# Patient Record
Sex: Female | Born: 1935 | Race: White | Hispanic: No | Smoking: Never smoker
Health system: Southern US, Community
[De-identification: ages and names within clinical notes are randomized; demographics above are authoritative.]

## PROBLEM LIST (undated history)

## (undated) DIAGNOSIS — D649 Anemia, unspecified: Secondary | ICD-10-CM

## (undated) DIAGNOSIS — Z9889 Other specified postprocedural states: Secondary | ICD-10-CM

## (undated) DIAGNOSIS — F0390 Unspecified dementia without behavioral disturbance: Secondary | ICD-10-CM

## (undated) DIAGNOSIS — Z9289 Personal history of other medical treatment: Secondary | ICD-10-CM

## (undated) DIAGNOSIS — E1142 Type 2 diabetes mellitus with diabetic polyneuropathy: Secondary | ICD-10-CM

## (undated) DIAGNOSIS — R112 Nausea with vomiting, unspecified: Secondary | ICD-10-CM

## (undated) DIAGNOSIS — G459 Transient cerebral ischemic attack, unspecified: Secondary | ICD-10-CM

## (undated) DIAGNOSIS — I4891 Unspecified atrial fibrillation: Secondary | ICD-10-CM

## (undated) DIAGNOSIS — I10 Essential (primary) hypertension: Secondary | ICD-10-CM

## (undated) DIAGNOSIS — I480 Paroxysmal atrial fibrillation: Secondary | ICD-10-CM

## (undated) DIAGNOSIS — I5032 Chronic diastolic (congestive) heart failure: Secondary | ICD-10-CM

## (undated) DIAGNOSIS — E119 Type 2 diabetes mellitus without complications: Secondary | ICD-10-CM

## (undated) DIAGNOSIS — E039 Hypothyroidism, unspecified: Secondary | ICD-10-CM

## (undated) HISTORY — PX: FRACTURE SURGERY: SHX138

## (undated) HISTORY — DX: Unspecified dementia, unspecified severity, without behavioral disturbance, psychotic disturbance, mood disturbance, and anxiety: F03.90

## (undated) HISTORY — PX: TIBIA FRACTURE SURGERY: SHX806

## (undated) HISTORY — PX: CATARACT EXTRACTION, BILATERAL: SHX1313

## (undated) HISTORY — PX: TONSILLECTOMY: SUR1361

---

## 2016-06-24 DIAGNOSIS — E109 Type 1 diabetes mellitus without complications: Secondary | ICD-10-CM | POA: Diagnosis not present

## 2016-06-28 DIAGNOSIS — E109 Type 1 diabetes mellitus without complications: Secondary | ICD-10-CM | POA: Diagnosis not present

## 2016-07-09 DIAGNOSIS — Z7901 Long term (current) use of anticoagulants: Secondary | ICD-10-CM | POA: Diagnosis not present

## 2016-07-09 DIAGNOSIS — I255 Ischemic cardiomyopathy: Secondary | ICD-10-CM | POA: Diagnosis not present

## 2016-07-16 DIAGNOSIS — I10 Essential (primary) hypertension: Secondary | ICD-10-CM | POA: Diagnosis not present

## 2016-07-16 DIAGNOSIS — E1042 Type 1 diabetes mellitus with diabetic polyneuropathy: Secondary | ICD-10-CM | POA: Diagnosis not present

## 2016-07-16 DIAGNOSIS — E162 Hypoglycemia, unspecified: Secondary | ICD-10-CM | POA: Diagnosis not present

## 2016-07-16 DIAGNOSIS — E039 Hypothyroidism, unspecified: Secondary | ICD-10-CM | POA: Diagnosis not present

## 2016-07-16 DIAGNOSIS — E1065 Type 1 diabetes mellitus with hyperglycemia: Secondary | ICD-10-CM | POA: Diagnosis not present

## 2016-07-30 DIAGNOSIS — E109 Type 1 diabetes mellitus without complications: Secondary | ICD-10-CM | POA: Diagnosis not present

## 2016-08-03 DIAGNOSIS — N390 Urinary tract infection, site not specified: Secondary | ICD-10-CM | POA: Diagnosis not present

## 2016-08-04 DIAGNOSIS — E109 Type 1 diabetes mellitus without complications: Secondary | ICD-10-CM | POA: Diagnosis not present

## 2016-08-06 DIAGNOSIS — I255 Ischemic cardiomyopathy: Secondary | ICD-10-CM | POA: Diagnosis not present

## 2016-08-06 DIAGNOSIS — Z7901 Long term (current) use of anticoagulants: Secondary | ICD-10-CM | POA: Diagnosis not present

## 2016-08-09 DIAGNOSIS — I70293 Other atherosclerosis of native arteries of extremities, bilateral legs: Secondary | ICD-10-CM | POA: Diagnosis not present

## 2016-08-09 DIAGNOSIS — E1149 Type 2 diabetes mellitus with other diabetic neurological complication: Secondary | ICD-10-CM | POA: Diagnosis not present

## 2016-08-09 DIAGNOSIS — E1142 Type 2 diabetes mellitus with diabetic polyneuropathy: Secondary | ICD-10-CM | POA: Diagnosis not present

## 2016-08-09 DIAGNOSIS — B351 Tinea unguium: Secondary | ICD-10-CM | POA: Diagnosis not present

## 2016-08-10 DIAGNOSIS — N390 Urinary tract infection, site not specified: Secondary | ICD-10-CM | POA: Diagnosis not present

## 2016-08-10 DIAGNOSIS — R6889 Other general symptoms and signs: Secondary | ICD-10-CM | POA: Diagnosis not present

## 2016-08-10 DIAGNOSIS — S20219D Contusion of unspecified front wall of thorax, subsequent encounter: Secondary | ICD-10-CM | POA: Diagnosis not present

## 2016-08-10 DIAGNOSIS — I119 Hypertensive heart disease without heart failure: Secondary | ICD-10-CM | POA: Diagnosis not present

## 2016-08-10 DIAGNOSIS — E109 Type 1 diabetes mellitus without complications: Secondary | ICD-10-CM | POA: Diagnosis not present

## 2016-08-10 DIAGNOSIS — R300 Dysuria: Secondary | ICD-10-CM | POA: Diagnosis not present

## 2016-08-18 DIAGNOSIS — I4891 Unspecified atrial fibrillation: Secondary | ICD-10-CM | POA: Diagnosis not present

## 2016-08-18 DIAGNOSIS — I501 Left ventricular failure: Secondary | ICD-10-CM | POA: Diagnosis not present

## 2016-08-18 DIAGNOSIS — I10 Essential (primary) hypertension: Secondary | ICD-10-CM | POA: Diagnosis not present

## 2016-09-03 DIAGNOSIS — I255 Ischemic cardiomyopathy: Secondary | ICD-10-CM | POA: Diagnosis not present

## 2016-09-03 DIAGNOSIS — Z7901 Long term (current) use of anticoagulants: Secondary | ICD-10-CM | POA: Diagnosis not present

## 2016-09-03 DIAGNOSIS — E1065 Type 1 diabetes mellitus with hyperglycemia: Secondary | ICD-10-CM | POA: Diagnosis not present

## 2016-09-03 DIAGNOSIS — E1165 Type 2 diabetes mellitus with hyperglycemia: Secondary | ICD-10-CM | POA: Diagnosis not present

## 2016-09-10 DIAGNOSIS — E039 Hypothyroidism, unspecified: Secondary | ICD-10-CM | POA: Diagnosis not present

## 2016-09-10 DIAGNOSIS — E1065 Type 1 diabetes mellitus with hyperglycemia: Secondary | ICD-10-CM | POA: Diagnosis not present

## 2016-09-10 DIAGNOSIS — E162 Hypoglycemia, unspecified: Secondary | ICD-10-CM | POA: Diagnosis not present

## 2016-09-10 DIAGNOSIS — E1042 Type 1 diabetes mellitus with diabetic polyneuropathy: Secondary | ICD-10-CM | POA: Diagnosis not present

## 2016-09-10 DIAGNOSIS — E1021 Type 1 diabetes mellitus with diabetic nephropathy: Secondary | ICD-10-CM | POA: Diagnosis not present

## 2016-09-10 DIAGNOSIS — I10 Essential (primary) hypertension: Secondary | ICD-10-CM | POA: Diagnosis not present

## 2016-09-28 DIAGNOSIS — I255 Ischemic cardiomyopathy: Secondary | ICD-10-CM | POA: Diagnosis not present

## 2016-09-28 DIAGNOSIS — Z7901 Long term (current) use of anticoagulants: Secondary | ICD-10-CM | POA: Diagnosis not present

## 2016-09-28 DIAGNOSIS — I10 Essential (primary) hypertension: Secondary | ICD-10-CM | POA: Diagnosis not present

## 2016-09-28 DIAGNOSIS — G459 Transient cerebral ischemic attack, unspecified: Secondary | ICD-10-CM | POA: Diagnosis not present

## 2016-09-28 DIAGNOSIS — I669 Occlusion and stenosis of unspecified cerebral artery: Secondary | ICD-10-CM | POA: Diagnosis not present

## 2016-10-04 DIAGNOSIS — E109 Type 1 diabetes mellitus without complications: Secondary | ICD-10-CM | POA: Diagnosis not present

## 2016-10-11 DIAGNOSIS — B351 Tinea unguium: Secondary | ICD-10-CM | POA: Diagnosis not present

## 2016-10-11 DIAGNOSIS — I70293 Other atherosclerosis of native arteries of extremities, bilateral legs: Secondary | ICD-10-CM | POA: Diagnosis not present

## 2016-10-11 DIAGNOSIS — E1149 Type 2 diabetes mellitus with other diabetic neurological complication: Secondary | ICD-10-CM | POA: Diagnosis not present

## 2016-10-11 DIAGNOSIS — E1142 Type 2 diabetes mellitus with diabetic polyneuropathy: Secondary | ICD-10-CM | POA: Diagnosis not present

## 2016-10-26 DIAGNOSIS — Z7901 Long term (current) use of anticoagulants: Secondary | ICD-10-CM | POA: Diagnosis not present

## 2016-10-26 DIAGNOSIS — I255 Ischemic cardiomyopathy: Secondary | ICD-10-CM | POA: Diagnosis not present

## 2016-10-27 DIAGNOSIS — I482 Chronic atrial fibrillation: Secondary | ICD-10-CM | POA: Diagnosis not present

## 2016-10-27 DIAGNOSIS — I10 Essential (primary) hypertension: Secondary | ICD-10-CM | POA: Diagnosis not present

## 2016-10-27 DIAGNOSIS — I5030 Unspecified diastolic (congestive) heart failure: Secondary | ICD-10-CM | POA: Diagnosis not present

## 2016-11-10 DIAGNOSIS — L814 Other melanin hyperpigmentation: Secondary | ICD-10-CM | POA: Diagnosis not present

## 2016-11-10 DIAGNOSIS — G459 Transient cerebral ischemic attack, unspecified: Secondary | ICD-10-CM | POA: Diagnosis not present

## 2016-11-10 DIAGNOSIS — L0102 Bockhart's impetigo: Secondary | ICD-10-CM | POA: Diagnosis not present

## 2016-11-10 DIAGNOSIS — Z7901 Long term (current) use of anticoagulants: Secondary | ICD-10-CM | POA: Diagnosis not present

## 2016-11-10 DIAGNOSIS — I669 Occlusion and stenosis of unspecified cerebral artery: Secondary | ICD-10-CM | POA: Diagnosis not present

## 2016-11-10 DIAGNOSIS — D485 Neoplasm of uncertain behavior of skin: Secondary | ICD-10-CM | POA: Diagnosis not present

## 2016-11-10 DIAGNOSIS — I255 Ischemic cardiomyopathy: Secondary | ICD-10-CM | POA: Diagnosis not present

## 2016-11-29 DIAGNOSIS — I255 Ischemic cardiomyopathy: Secondary | ICD-10-CM | POA: Diagnosis not present

## 2016-11-29 DIAGNOSIS — Z7901 Long term (current) use of anticoagulants: Secondary | ICD-10-CM | POA: Diagnosis not present

## 2016-11-29 DIAGNOSIS — I669 Occlusion and stenosis of unspecified cerebral artery: Secondary | ICD-10-CM | POA: Diagnosis not present

## 2016-11-29 DIAGNOSIS — G459 Transient cerebral ischemic attack, unspecified: Secondary | ICD-10-CM | POA: Diagnosis not present

## 2016-12-10 DIAGNOSIS — E1065 Type 1 diabetes mellitus with hyperglycemia: Secondary | ICD-10-CM | POA: Diagnosis not present

## 2016-12-10 DIAGNOSIS — E039 Hypothyroidism, unspecified: Secondary | ICD-10-CM | POA: Diagnosis not present

## 2016-12-17 DIAGNOSIS — E039 Hypothyroidism, unspecified: Secondary | ICD-10-CM | POA: Diagnosis not present

## 2016-12-17 DIAGNOSIS — I10 Essential (primary) hypertension: Secondary | ICD-10-CM | POA: Diagnosis not present

## 2016-12-17 DIAGNOSIS — E1021 Type 1 diabetes mellitus with diabetic nephropathy: Secondary | ICD-10-CM | POA: Diagnosis not present

## 2016-12-17 DIAGNOSIS — E162 Hypoglycemia, unspecified: Secondary | ICD-10-CM | POA: Diagnosis not present

## 2016-12-17 DIAGNOSIS — E1065 Type 1 diabetes mellitus with hyperglycemia: Secondary | ICD-10-CM | POA: Diagnosis not present

## 2016-12-17 DIAGNOSIS — E1042 Type 1 diabetes mellitus with diabetic polyneuropathy: Secondary | ICD-10-CM | POA: Diagnosis not present

## 2016-12-20 DIAGNOSIS — E1149 Type 2 diabetes mellitus with other diabetic neurological complication: Secondary | ICD-10-CM | POA: Diagnosis not present

## 2016-12-20 DIAGNOSIS — I70293 Other atherosclerosis of native arteries of extremities, bilateral legs: Secondary | ICD-10-CM | POA: Diagnosis not present

## 2016-12-20 DIAGNOSIS — B351 Tinea unguium: Secondary | ICD-10-CM | POA: Diagnosis not present

## 2016-12-28 DIAGNOSIS — I669 Occlusion and stenosis of unspecified cerebral artery: Secondary | ICD-10-CM | POA: Diagnosis not present

## 2016-12-28 DIAGNOSIS — G459 Transient cerebral ischemic attack, unspecified: Secondary | ICD-10-CM | POA: Diagnosis not present

## 2016-12-28 DIAGNOSIS — I255 Ischemic cardiomyopathy: Secondary | ICD-10-CM | POA: Diagnosis not present

## 2016-12-28 DIAGNOSIS — Z7901 Long term (current) use of anticoagulants: Secondary | ICD-10-CM | POA: Diagnosis not present

## 2016-12-30 DIAGNOSIS — E109 Type 1 diabetes mellitus without complications: Secondary | ICD-10-CM | POA: Diagnosis not present

## 2017-01-11 DIAGNOSIS — E119 Type 2 diabetes mellitus without complications: Secondary | ICD-10-CM | POA: Diagnosis not present

## 2017-01-11 DIAGNOSIS — S66911A Strain of unspecified muscle, fascia and tendon at wrist and hand level, right hand, initial encounter: Secondary | ICD-10-CM | POA: Diagnosis not present

## 2017-01-19 DIAGNOSIS — I480 Paroxysmal atrial fibrillation: Secondary | ICD-10-CM | POA: Diagnosis not present

## 2017-01-19 DIAGNOSIS — I10 Essential (primary) hypertension: Secondary | ICD-10-CM | POA: Diagnosis not present

## 2017-01-19 DIAGNOSIS — I5030 Unspecified diastolic (congestive) heart failure: Secondary | ICD-10-CM | POA: Diagnosis not present

## 2017-02-01 DIAGNOSIS — Z7901 Long term (current) use of anticoagulants: Secondary | ICD-10-CM | POA: Diagnosis not present

## 2017-02-01 DIAGNOSIS — I255 Ischemic cardiomyopathy: Secondary | ICD-10-CM | POA: Diagnosis not present

## 2017-02-01 DIAGNOSIS — I669 Occlusion and stenosis of unspecified cerebral artery: Secondary | ICD-10-CM | POA: Diagnosis not present

## 2017-02-01 DIAGNOSIS — G459 Transient cerebral ischemic attack, unspecified: Secondary | ICD-10-CM | POA: Diagnosis not present

## 2017-02-05 DIAGNOSIS — R740 Nonspecific elevation of levels of transaminase and lactic acid dehydrogenase [LDH]: Secondary | ICD-10-CM | POA: Diagnosis not present

## 2017-02-05 DIAGNOSIS — E039 Hypothyroidism, unspecified: Secondary | ICD-10-CM | POA: Diagnosis not present

## 2017-02-05 DIAGNOSIS — E119 Type 2 diabetes mellitus without complications: Secondary | ICD-10-CM | POA: Diagnosis not present

## 2017-02-05 DIAGNOSIS — E784 Other hyperlipidemia: Secondary | ICD-10-CM | POA: Diagnosis not present

## 2017-02-05 DIAGNOSIS — E559 Vitamin D deficiency, unspecified: Secondary | ICD-10-CM | POA: Diagnosis not present

## 2017-02-07 DIAGNOSIS — M9902 Segmental and somatic dysfunction of thoracic region: Secondary | ICD-10-CM | POA: Diagnosis not present

## 2017-02-07 DIAGNOSIS — M7501 Adhesive capsulitis of right shoulder: Secondary | ICD-10-CM | POA: Diagnosis not present

## 2017-02-07 DIAGNOSIS — M9904 Segmental and somatic dysfunction of sacral region: Secondary | ICD-10-CM | POA: Diagnosis not present

## 2017-02-07 DIAGNOSIS — S63502A Unspecified sprain of left wrist, initial encounter: Secondary | ICD-10-CM | POA: Diagnosis not present

## 2017-02-07 DIAGNOSIS — M9903 Segmental and somatic dysfunction of lumbar region: Secondary | ICD-10-CM | POA: Diagnosis not present

## 2017-02-07 DIAGNOSIS — M5416 Radiculopathy, lumbar region: Secondary | ICD-10-CM | POA: Diagnosis not present

## 2017-02-07 DIAGNOSIS — E119 Type 2 diabetes mellitus without complications: Secondary | ICD-10-CM | POA: Diagnosis not present

## 2017-02-09 DIAGNOSIS — M9903 Segmental and somatic dysfunction of lumbar region: Secondary | ICD-10-CM | POA: Diagnosis not present

## 2017-02-09 DIAGNOSIS — M9902 Segmental and somatic dysfunction of thoracic region: Secondary | ICD-10-CM | POA: Diagnosis not present

## 2017-02-09 DIAGNOSIS — M9904 Segmental and somatic dysfunction of sacral region: Secondary | ICD-10-CM | POA: Diagnosis not present

## 2017-02-09 DIAGNOSIS — M5416 Radiculopathy, lumbar region: Secondary | ICD-10-CM | POA: Diagnosis not present

## 2017-02-10 DIAGNOSIS — M9903 Segmental and somatic dysfunction of lumbar region: Secondary | ICD-10-CM | POA: Diagnosis not present

## 2017-02-10 DIAGNOSIS — M5416 Radiculopathy, lumbar region: Secondary | ICD-10-CM | POA: Diagnosis not present

## 2017-02-10 DIAGNOSIS — M9904 Segmental and somatic dysfunction of sacral region: Secondary | ICD-10-CM | POA: Diagnosis not present

## 2017-02-10 DIAGNOSIS — M9902 Segmental and somatic dysfunction of thoracic region: Secondary | ICD-10-CM | POA: Diagnosis not present

## 2017-02-14 DIAGNOSIS — M9902 Segmental and somatic dysfunction of thoracic region: Secondary | ICD-10-CM | POA: Diagnosis not present

## 2017-02-14 DIAGNOSIS — M9904 Segmental and somatic dysfunction of sacral region: Secondary | ICD-10-CM | POA: Diagnosis not present

## 2017-02-14 DIAGNOSIS — M5416 Radiculopathy, lumbar region: Secondary | ICD-10-CM | POA: Diagnosis not present

## 2017-02-14 DIAGNOSIS — M9903 Segmental and somatic dysfunction of lumbar region: Secondary | ICD-10-CM | POA: Diagnosis not present

## 2017-02-16 DIAGNOSIS — M9902 Segmental and somatic dysfunction of thoracic region: Secondary | ICD-10-CM | POA: Diagnosis not present

## 2017-02-16 DIAGNOSIS — G459 Transient cerebral ischemic attack, unspecified: Secondary | ICD-10-CM | POA: Diagnosis not present

## 2017-02-16 DIAGNOSIS — M9903 Segmental and somatic dysfunction of lumbar region: Secondary | ICD-10-CM | POA: Diagnosis not present

## 2017-02-16 DIAGNOSIS — Z7901 Long term (current) use of anticoagulants: Secondary | ICD-10-CM | POA: Diagnosis not present

## 2017-02-16 DIAGNOSIS — I669 Occlusion and stenosis of unspecified cerebral artery: Secondary | ICD-10-CM | POA: Diagnosis not present

## 2017-02-16 DIAGNOSIS — M9904 Segmental and somatic dysfunction of sacral region: Secondary | ICD-10-CM | POA: Diagnosis not present

## 2017-02-16 DIAGNOSIS — M5416 Radiculopathy, lumbar region: Secondary | ICD-10-CM | POA: Diagnosis not present

## 2017-02-16 DIAGNOSIS — I255 Ischemic cardiomyopathy: Secondary | ICD-10-CM | POA: Diagnosis not present

## 2017-02-18 DIAGNOSIS — M9904 Segmental and somatic dysfunction of sacral region: Secondary | ICD-10-CM | POA: Diagnosis not present

## 2017-02-18 DIAGNOSIS — M5416 Radiculopathy, lumbar region: Secondary | ICD-10-CM | POA: Diagnosis not present

## 2017-02-18 DIAGNOSIS — M9902 Segmental and somatic dysfunction of thoracic region: Secondary | ICD-10-CM | POA: Diagnosis not present

## 2017-02-18 DIAGNOSIS — M9903 Segmental and somatic dysfunction of lumbar region: Secondary | ICD-10-CM | POA: Diagnosis not present

## 2017-02-23 DIAGNOSIS — M9902 Segmental and somatic dysfunction of thoracic region: Secondary | ICD-10-CM | POA: Diagnosis not present

## 2017-02-23 DIAGNOSIS — M5416 Radiculopathy, lumbar region: Secondary | ICD-10-CM | POA: Diagnosis not present

## 2017-02-23 DIAGNOSIS — M9904 Segmental and somatic dysfunction of sacral region: Secondary | ICD-10-CM | POA: Diagnosis not present

## 2017-02-23 DIAGNOSIS — M9903 Segmental and somatic dysfunction of lumbar region: Secondary | ICD-10-CM | POA: Diagnosis not present

## 2017-03-03 DIAGNOSIS — S43401A Unspecified sprain of right shoulder joint, initial encounter: Secondary | ICD-10-CM | POA: Diagnosis not present

## 2017-03-03 DIAGNOSIS — M9902 Segmental and somatic dysfunction of thoracic region: Secondary | ICD-10-CM | POA: Diagnosis not present

## 2017-03-03 DIAGNOSIS — M9903 Segmental and somatic dysfunction of lumbar region: Secondary | ICD-10-CM | POA: Diagnosis not present

## 2017-03-03 DIAGNOSIS — M5416 Radiculopathy, lumbar region: Secondary | ICD-10-CM | POA: Diagnosis not present

## 2017-03-03 DIAGNOSIS — M9904 Segmental and somatic dysfunction of sacral region: Secondary | ICD-10-CM | POA: Diagnosis not present

## 2017-03-03 DIAGNOSIS — E784 Other hyperlipidemia: Secondary | ICD-10-CM | POA: Diagnosis not present

## 2017-03-07 DIAGNOSIS — I255 Ischemic cardiomyopathy: Secondary | ICD-10-CM | POA: Diagnosis not present

## 2017-03-07 DIAGNOSIS — G459 Transient cerebral ischemic attack, unspecified: Secondary | ICD-10-CM | POA: Diagnosis not present

## 2017-03-07 DIAGNOSIS — Z7901 Long term (current) use of anticoagulants: Secondary | ICD-10-CM | POA: Diagnosis not present

## 2017-03-07 DIAGNOSIS — I669 Occlusion and stenosis of unspecified cerebral artery: Secondary | ICD-10-CM | POA: Diagnosis not present

## 2017-03-11 DIAGNOSIS — G459 Transient cerebral ischemic attack, unspecified: Secondary | ICD-10-CM | POA: Diagnosis not present

## 2017-03-11 DIAGNOSIS — I255 Ischemic cardiomyopathy: Secondary | ICD-10-CM | POA: Diagnosis not present

## 2017-03-11 DIAGNOSIS — I669 Occlusion and stenosis of unspecified cerebral artery: Secondary | ICD-10-CM | POA: Diagnosis not present

## 2017-03-11 DIAGNOSIS — Z7901 Long term (current) use of anticoagulants: Secondary | ICD-10-CM | POA: Diagnosis not present

## 2017-03-15 DIAGNOSIS — E1065 Type 1 diabetes mellitus with hyperglycemia: Secondary | ICD-10-CM | POA: Diagnosis not present

## 2017-03-17 DIAGNOSIS — I10 Essential (primary) hypertension: Secondary | ICD-10-CM | POA: Diagnosis not present

## 2017-03-17 DIAGNOSIS — I255 Ischemic cardiomyopathy: Secondary | ICD-10-CM | POA: Diagnosis not present

## 2017-03-17 DIAGNOSIS — I669 Occlusion and stenosis of unspecified cerebral artery: Secondary | ICD-10-CM | POA: Diagnosis not present

## 2017-03-17 DIAGNOSIS — E1065 Type 1 diabetes mellitus with hyperglycemia: Secondary | ICD-10-CM | POA: Diagnosis not present

## 2017-03-17 DIAGNOSIS — E039 Hypothyroidism, unspecified: Secondary | ICD-10-CM | POA: Diagnosis not present

## 2017-03-17 DIAGNOSIS — Z7901 Long term (current) use of anticoagulants: Secondary | ICD-10-CM | POA: Diagnosis not present

## 2017-03-17 DIAGNOSIS — E1021 Type 1 diabetes mellitus with diabetic nephropathy: Secondary | ICD-10-CM | POA: Diagnosis not present

## 2017-03-17 DIAGNOSIS — G459 Transient cerebral ischemic attack, unspecified: Secondary | ICD-10-CM | POA: Diagnosis not present

## 2017-03-17 DIAGNOSIS — E162 Hypoglycemia, unspecified: Secondary | ICD-10-CM | POA: Diagnosis not present

## 2017-03-17 DIAGNOSIS — E1042 Type 1 diabetes mellitus with diabetic polyneuropathy: Secondary | ICD-10-CM | POA: Diagnosis not present

## 2017-03-18 DIAGNOSIS — M9904 Segmental and somatic dysfunction of sacral region: Secondary | ICD-10-CM | POA: Diagnosis not present

## 2017-03-18 DIAGNOSIS — M9903 Segmental and somatic dysfunction of lumbar region: Secondary | ICD-10-CM | POA: Diagnosis not present

## 2017-03-18 DIAGNOSIS — M9902 Segmental and somatic dysfunction of thoracic region: Secondary | ICD-10-CM | POA: Diagnosis not present

## 2017-03-18 DIAGNOSIS — M5416 Radiculopathy, lumbar region: Secondary | ICD-10-CM | POA: Diagnosis not present

## 2017-03-30 DIAGNOSIS — N390 Urinary tract infection, site not specified: Secondary | ICD-10-CM | POA: Diagnosis not present

## 2017-03-30 DIAGNOSIS — A499 Bacterial infection, unspecified: Secondary | ICD-10-CM | POA: Diagnosis not present

## 2017-03-30 DIAGNOSIS — E109 Type 1 diabetes mellitus without complications: Secondary | ICD-10-CM | POA: Diagnosis not present

## 2017-03-30 DIAGNOSIS — R300 Dysuria: Secondary | ICD-10-CM | POA: Diagnosis not present

## 2017-04-01 DIAGNOSIS — Z7901 Long term (current) use of anticoagulants: Secondary | ICD-10-CM | POA: Diagnosis not present

## 2017-04-05 DIAGNOSIS — E119 Type 2 diabetes mellitus without complications: Secondary | ICD-10-CM | POA: Diagnosis not present

## 2017-04-05 DIAGNOSIS — R300 Dysuria: Secondary | ICD-10-CM | POA: Diagnosis not present

## 2017-04-11 DIAGNOSIS — M79674 Pain in right toe(s): Secondary | ICD-10-CM | POA: Diagnosis not present

## 2017-04-11 DIAGNOSIS — E1151 Type 2 diabetes mellitus with diabetic peripheral angiopathy without gangrene: Secondary | ICD-10-CM | POA: Diagnosis not present

## 2017-04-11 DIAGNOSIS — I739 Peripheral vascular disease, unspecified: Secondary | ICD-10-CM | POA: Diagnosis not present

## 2017-04-11 DIAGNOSIS — M79675 Pain in left toe(s): Secondary | ICD-10-CM | POA: Diagnosis not present

## 2017-04-11 DIAGNOSIS — B351 Tinea unguium: Secondary | ICD-10-CM | POA: Diagnosis not present

## 2017-04-12 DIAGNOSIS — E119 Type 2 diabetes mellitus without complications: Secondary | ICD-10-CM | POA: Diagnosis not present

## 2017-04-12 DIAGNOSIS — N390 Urinary tract infection, site not specified: Secondary | ICD-10-CM | POA: Diagnosis not present

## 2017-04-12 DIAGNOSIS — A499 Bacterial infection, unspecified: Secondary | ICD-10-CM | POA: Diagnosis not present

## 2017-04-14 DIAGNOSIS — E1021 Type 1 diabetes mellitus with diabetic nephropathy: Secondary | ICD-10-CM | POA: Diagnosis not present

## 2017-04-14 DIAGNOSIS — Z6828 Body mass index (BMI) 28.0-28.9, adult: Secondary | ICD-10-CM | POA: Diagnosis not present

## 2017-04-14 DIAGNOSIS — Z7901 Long term (current) use of anticoagulants: Secondary | ICD-10-CM | POA: Diagnosis not present

## 2017-04-14 DIAGNOSIS — I480 Paroxysmal atrial fibrillation: Secondary | ICD-10-CM | POA: Diagnosis not present

## 2017-04-14 DIAGNOSIS — R2689 Other abnormalities of gait and mobility: Secondary | ICD-10-CM | POA: Diagnosis not present

## 2017-04-14 DIAGNOSIS — N08 Glomerular disorders in diseases classified elsewhere: Secondary | ICD-10-CM | POA: Diagnosis not present

## 2017-04-14 DIAGNOSIS — E1142 Type 2 diabetes mellitus with diabetic polyneuropathy: Secondary | ICD-10-CM | POA: Diagnosis not present

## 2017-04-14 DIAGNOSIS — E038 Other specified hypothyroidism: Secondary | ICD-10-CM | POA: Diagnosis not present

## 2017-04-14 DIAGNOSIS — D6489 Other specified anemias: Secondary | ICD-10-CM | POA: Diagnosis not present

## 2017-04-14 DIAGNOSIS — I7389 Other specified peripheral vascular diseases: Secondary | ICD-10-CM | POA: Diagnosis not present

## 2017-04-21 DIAGNOSIS — Z6820 Body mass index (BMI) 20.0-20.9, adult: Secondary | ICD-10-CM | POA: Diagnosis not present

## 2017-04-21 DIAGNOSIS — R350 Frequency of micturition: Secondary | ICD-10-CM | POA: Diagnosis not present

## 2017-04-21 DIAGNOSIS — N390 Urinary tract infection, site not specified: Secondary | ICD-10-CM | POA: Diagnosis not present

## 2017-04-21 DIAGNOSIS — R8290 Unspecified abnormal findings in urine: Secondary | ICD-10-CM | POA: Diagnosis not present

## 2017-04-21 DIAGNOSIS — E1021 Type 1 diabetes mellitus with diabetic nephropathy: Secondary | ICD-10-CM | POA: Diagnosis not present

## 2017-04-29 DIAGNOSIS — Z7901 Long term (current) use of anticoagulants: Secondary | ICD-10-CM | POA: Diagnosis not present

## 2017-05-04 DIAGNOSIS — B9689 Other specified bacterial agents as the cause of diseases classified elsewhere: Secondary | ICD-10-CM | POA: Diagnosis not present

## 2017-05-04 DIAGNOSIS — L02425 Furuncle of right lower limb: Secondary | ICD-10-CM | POA: Diagnosis not present

## 2017-05-09 DIAGNOSIS — N08 Glomerular disorders in diseases classified elsewhere: Secondary | ICD-10-CM | POA: Diagnosis not present

## 2017-05-09 DIAGNOSIS — E038 Other specified hypothyroidism: Secondary | ICD-10-CM | POA: Diagnosis not present

## 2017-05-09 DIAGNOSIS — Z7901 Long term (current) use of anticoagulants: Secondary | ICD-10-CM | POA: Diagnosis not present

## 2017-05-09 DIAGNOSIS — E1021 Type 1 diabetes mellitus with diabetic nephropathy: Secondary | ICD-10-CM | POA: Diagnosis not present

## 2017-05-09 DIAGNOSIS — L089 Local infection of the skin and subcutaneous tissue, unspecified: Secondary | ICD-10-CM | POA: Diagnosis not present

## 2017-05-09 DIAGNOSIS — R609 Edema, unspecified: Secondary | ICD-10-CM | POA: Diagnosis not present

## 2017-05-20 DIAGNOSIS — E1021 Type 1 diabetes mellitus with diabetic nephropathy: Secondary | ICD-10-CM | POA: Diagnosis not present

## 2017-05-25 DIAGNOSIS — Z794 Long term (current) use of insulin: Secondary | ICD-10-CM | POA: Diagnosis not present

## 2017-05-25 DIAGNOSIS — I10 Essential (primary) hypertension: Secondary | ICD-10-CM | POA: Diagnosis not present

## 2017-05-25 DIAGNOSIS — L720 Epidermal cyst: Secondary | ICD-10-CM | POA: Diagnosis not present

## 2017-05-25 DIAGNOSIS — E1021 Type 1 diabetes mellitus with diabetic nephropathy: Secondary | ICD-10-CM | POA: Diagnosis not present

## 2017-05-27 DIAGNOSIS — Z7901 Long term (current) use of anticoagulants: Secondary | ICD-10-CM | POA: Diagnosis not present

## 2017-06-02 DIAGNOSIS — R350 Frequency of micturition: Secondary | ICD-10-CM | POA: Diagnosis not present

## 2017-06-02 DIAGNOSIS — N390 Urinary tract infection, site not specified: Secondary | ICD-10-CM | POA: Diagnosis not present

## 2017-06-03 DIAGNOSIS — L02425 Furuncle of right lower limb: Secondary | ICD-10-CM | POA: Diagnosis not present

## 2017-06-03 DIAGNOSIS — I872 Venous insufficiency (chronic) (peripheral): Secondary | ICD-10-CM | POA: Diagnosis not present

## 2017-06-03 DIAGNOSIS — B9689 Other specified bacterial agents as the cause of diseases classified elsewhere: Secondary | ICD-10-CM | POA: Diagnosis not present

## 2017-06-08 ENCOUNTER — Encounter (HOSPITAL_COMMUNITY): Payer: Self-pay | Admitting: *Deleted

## 2017-06-08 ENCOUNTER — Inpatient Hospital Stay (HOSPITAL_COMMUNITY)
Admission: EM | Admit: 2017-06-08 | Discharge: 2017-06-10 | DRG: 291 | Disposition: A | Discharge status: Home / Self Care | Payer: Medicare HMO | Attending: Family Medicine | Admitting: Family Medicine

## 2017-06-08 ENCOUNTER — Other Ambulatory Visit: Payer: Self-pay

## 2017-06-08 ENCOUNTER — Emergency Department (HOSPITAL_COMMUNITY): Payer: Medicare HMO

## 2017-06-08 DIAGNOSIS — I110 Hypertensive heart disease with heart failure: Principal | ICD-10-CM | POA: Diagnosis present

## 2017-06-08 DIAGNOSIS — Z91018 Allergy to other foods: Secondary | ICD-10-CM

## 2017-06-08 DIAGNOSIS — E119 Type 2 diabetes mellitus without complications: Secondary | ICD-10-CM | POA: Diagnosis present

## 2017-06-08 DIAGNOSIS — Z8673 Personal history of transient ischemic attack (TIA), and cerebral infarction without residual deficits: Secondary | ICD-10-CM | POA: Diagnosis not present

## 2017-06-08 DIAGNOSIS — Z79890 Hormone replacement therapy (postmenopausal): Secondary | ICD-10-CM

## 2017-06-08 DIAGNOSIS — I482 Chronic atrial fibrillation: Secondary | ICD-10-CM | POA: Diagnosis not present

## 2017-06-08 DIAGNOSIS — E118 Type 2 diabetes mellitus with unspecified complications: Secondary | ICD-10-CM | POA: Diagnosis not present

## 2017-06-08 DIAGNOSIS — J9601 Acute respiratory failure with hypoxia: Secondary | ICD-10-CM | POA: Diagnosis present

## 2017-06-08 DIAGNOSIS — E039 Hypothyroidism, unspecified: Secondary | ICD-10-CM | POA: Diagnosis present

## 2017-06-08 DIAGNOSIS — Z7901 Long term (current) use of anticoagulants: Secondary | ICD-10-CM

## 2017-06-08 DIAGNOSIS — Z794 Long term (current) use of insulin: Secondary | ICD-10-CM | POA: Diagnosis not present

## 2017-06-08 DIAGNOSIS — I5033 Acute on chronic diastolic (congestive) heart failure: Secondary | ICD-10-CM | POA: Diagnosis present

## 2017-06-08 DIAGNOSIS — Z7982 Long term (current) use of aspirin: Secondary | ICD-10-CM | POA: Diagnosis not present

## 2017-06-08 DIAGNOSIS — I361 Nonrheumatic tricuspid (valve) insufficiency: Secondary | ICD-10-CM | POA: Diagnosis not present

## 2017-06-08 DIAGNOSIS — R0902 Hypoxemia: Secondary | ICD-10-CM

## 2017-06-08 DIAGNOSIS — I10 Essential (primary) hypertension: Secondary | ICD-10-CM | POA: Diagnosis not present

## 2017-06-08 DIAGNOSIS — I509 Heart failure, unspecified: Secondary | ICD-10-CM | POA: Diagnosis not present

## 2017-06-08 DIAGNOSIS — R0602 Shortness of breath: Secondary | ICD-10-CM | POA: Diagnosis not present

## 2017-06-08 LAB — BASIC METABOLIC PANEL
ANION GAP: 9 (ref 5–15)
Anion gap: 9 (ref 5–15)
BUN: 21 mg/dL — ABNORMAL HIGH (ref 6–20)
BUN: 21 mg/dL — ABNORMAL HIGH (ref 6–20)
CALCIUM: 9 mg/dL (ref 8.9–10.3)
CO2: 22 mmol/L (ref 22–32)
CO2: 26 mmol/L (ref 22–32)
Calcium: 9.3 mg/dL (ref 8.9–10.3)
Chloride: 98 mmol/L — ABNORMAL LOW (ref 101–111)
Chloride: 101 mmol/L (ref 101–111)
Creatinine, Ser: 0.84 mg/dL (ref 0.44–1.00)
Creatinine, Ser: 0.97 mg/dL (ref 0.44–1.00)
GFR calc Af Amer: 59 mL/min — ABNORMAL LOW (ref >60)
GFR calc non Af Amer: 51 mL/min — ABNORMAL LOW (ref >60)
Glucose, Bld: 246 mg/dL — ABNORMAL HIGH (ref 65–99)
Glucose, Bld: 83 mg/dL (ref 65–99)
POTASSIUM: 4.2 mmol/L (ref 3.5–5.1)
Potassium: 4.3 mmol/L (ref 3.5–5.1)
SODIUM: 129 mmol/L — AB (ref 135–145)
Sodium: 136 mmol/L (ref 135–145)

## 2017-06-08 LAB — PROTIME-INR
INR: 2.08
Prothrombin Time: 23.2 seconds — ABNORMAL HIGH (ref 11.4–15.2)

## 2017-06-08 LAB — CBC
HEMATOCRIT: 37 % (ref 36.0–46.0)
HEMOGLOBIN: 12.3 g/dL (ref 12.0–15.0)
MCH: 28.3 pg (ref 26.0–34.0)
MCHC: 33.2 g/dL (ref 30.0–36.0)
MCV: 85.3 fL (ref 78.0–100.0)
Platelets: 217 10*3/uL (ref 150–400)
RBC: 4.34 MIL/uL (ref 3.87–5.11)
RDW: 15.5 % (ref 11.5–15.5)
WBC: 5.8 10*3/uL (ref 4.0–10.5)

## 2017-06-08 LAB — HEMOGLOBIN A1C
Hgb A1c MFr Bld: 7.9 % — ABNORMAL HIGH (ref 4.8–5.6)
Mean Plasma Glucose: 180.03 mg/dL

## 2017-06-08 LAB — GLUCOSE, CAPILLARY: Glucose-Capillary: 207 mg/dL — ABNORMAL HIGH (ref 65–99)

## 2017-06-08 LAB — I-STAT TROPONIN, ED: TROPONIN I, POC: 0.01 ng/mL (ref 0.00–0.08)

## 2017-06-08 LAB — TROPONIN I: Troponin I: <0.03 ng/mL (ref <0.03)

## 2017-06-08 LAB — CBG MONITORING, ED: Glucose-Capillary: 208 mg/dL — ABNORMAL HIGH (ref 65–99)

## 2017-06-08 LAB — MAGNESIUM: Magnesium: 1.9 mg/dL (ref 1.7–2.4)

## 2017-06-08 LAB — BRAIN NATRIURETIC PEPTIDE: B NATRIURETIC PEPTIDE 5: 317.3 pg/mL — AB (ref 0.0–100.0)

## 2017-06-08 MED ORDER — WARFARIN - PHARMACIST DOSING INPATIENT
Freq: Every day | Status: DC
  Administered 2017-06-09: 18:00:00

## 2017-06-08 MED ORDER — WARFARIN SODIUM 2.5 MG PO TABS: 2.5000 mg | ORAL_TABLET | ORAL | Status: DC

## 2017-06-08 MED ORDER — WARFARIN SODIUM 5 MG PO TABS
5.0000 mg | ORAL_TABLET | ORAL | Status: DC
  Administered 2017-06-09: 5 mg via ORAL
  Filled 2017-06-08: qty 1

## 2017-06-08 MED ORDER — WARFARIN SODIUM 5 MG PO TABS: 5.0000 mg | ORAL_TABLET | ORAL | Status: DC

## 2017-06-08 MED ORDER — FUROSEMIDE 10 MG/ML IJ SOLN
40.0000 mg | Freq: Two times a day (BID) | INTRAMUSCULAR | Status: DC
  Administered 2017-06-08 – 2017-06-10 (×4): 40 mg via INTRAVENOUS
  Filled 2017-06-08 (×4): qty 4

## 2017-06-08 MED ORDER — INSULIN ASPART 100 UNIT/ML ~~LOC~~ SOLN
0.0000 [IU] | Freq: Three times a day (TID) | SUBCUTANEOUS | Status: DC
  Administered 2017-06-08: 3 [IU] via SUBCUTANEOUS
  Administered 2017-06-09: 5 [IU] via SUBCUTANEOUS
  Administered 2017-06-09: 3 [IU] via SUBCUTANEOUS
  Administered 2017-06-09: 1 [IU] via SUBCUTANEOUS
  Administered 2017-06-10: 5 [IU] via SUBCUTANEOUS
  Administered 2017-06-10: 1 [IU] via SUBCUTANEOUS
  Filled 2017-06-08: qty 1

## 2017-06-08 MED ORDER — ASPIRIN EC 81 MG PO TBEC
81.0000 mg | DELAYED_RELEASE_TABLET | Freq: Every day | ORAL | Status: DC
  Administered 2017-06-09 – 2017-06-10 (×2): 81 mg via ORAL
  Filled 2017-06-08 (×2): qty 1

## 2017-06-08 MED ORDER — AMLODIPINE BESYLATE 10 MG PO TABS
10.0000 mg | ORAL_TABLET | Freq: Every day | ORAL | Status: DC
  Administered 2017-06-09 – 2017-06-10 (×2): 10 mg via ORAL
  Filled 2017-06-08 (×2): qty 1

## 2017-06-08 MED ORDER — FUROSEMIDE 10 MG/ML IJ SOLN
40.0000 mg | Freq: Once | INTRAMUSCULAR | Status: AC
  Administered 2017-06-08: 40 mg via INTRAVENOUS
  Filled 2017-06-08: qty 4

## 2017-06-08 MED ORDER — AMOXICILLIN-POT CLAVULANATE 875-125 MG PO TABS
1.0000 | ORAL_TABLET | Freq: Two times a day (BID) | ORAL | Status: DC
  Administered 2017-06-08 – 2017-06-09 (×2): 1 via ORAL
  Filled 2017-06-08 (×2): qty 1

## 2017-06-08 MED ORDER — OCUVITE-LUTEIN PO CAPS
1.0000 | ORAL_CAPSULE | Freq: Every day | ORAL | Status: DC
  Filled 2017-06-08: qty 1

## 2017-06-08 MED ORDER — LEVOTHYROXINE SODIUM 50 MCG PO TABS
50.0000 ug | ORAL_TABLET | Freq: Every day | ORAL | Status: DC
  Administered 2017-06-09 – 2017-06-10 (×2): 50 ug via ORAL
  Filled 2017-06-08 (×2): qty 1

## 2017-06-08 MED ORDER — CARVEDILOL 3.125 MG PO TABS
3.1250 mg | ORAL_TABLET | Freq: Two times a day (BID) | ORAL | Status: DC
  Administered 2017-06-09: 3.125 mg via ORAL
  Filled 2017-06-08: qty 1

## 2017-06-08 MED ORDER — WARFARIN SODIUM 2.5 MG PO TABS
2.5000 mg | ORAL_TABLET | ORAL | Status: DC
  Administered 2017-06-08: 2.5 mg via ORAL
  Filled 2017-06-08: qty 1

## 2017-06-08 MED ORDER — LISINOPRIL 10 MG PO TABS
10.0000 mg | ORAL_TABLET | Freq: Every day | ORAL | Status: DC
  Administered 2017-06-09 – 2017-06-10 (×2): 10 mg via ORAL
  Filled 2017-06-08 (×2): qty 1

## 2017-06-08 NOTE — Progress Notes (Signed)
ANTICOAGULATION CONSULT NOTE - Initial Consult  Pharmacy Consult for Warfarin Indication: atrial fibrillation  Allergies  Allergen Reactions  . Cantaloupe (Diagnostic) Swelling    Patient Measurements: Height: 5\' 1"  (154.9 cm) Weight: 126 lb 9.6 oz (57.4 kg) IBW/kg (Calculated) : 47.8  Vital Signs: Temp: 97.5 F (36.4 C) (12/19 2025) Temp Source: Axillary (12/19 2025) BP: 187/82 (12/19 2025) Pulse Rate: 77 (12/19 2025)  Labs: Recent Labs    06/08/17 1014 06/08/17 2127  HGB 12.3  --   HCT 37.0  --   PLT 217  --   LABPROT  --  23.2*  INR  --  2.08  CREATININE 0.84  --     Estimated Creatinine Clearance: 37.7 mL/min (by C-G formula based on SCr of 0.84 mg/dL).   Medical History: Past Medical History:  Diagnosis Date  . Diabetes mellitus without complication (HCC)     Medications:  Medications Prior to Admission  Medication Sig Dispense Refill Last Dose  . amLODipine (NORVASC) 10 MG tablet Take 10 mg by mouth daily.   06/08/2017 at Unknown time  . amoxicillin-clavulanate (AUGMENTIN) 875-125 MG tablet Take 1 tablet by mouth 2 (two) times daily.   06/07/2017 at Unknown time  . aspirin EC 81 MG tablet Take 81 mg by mouth daily.   06/08/2017 at Unknown time  . Bilberry 500 MG CAPS Take 1 capsule by mouth daily.   06/07/2017 at Unknown time  . FeAspGl-FeFum-B12-FA-C-Succ Ac (FERREX 28) MISC Take 28 mg by mouth every other day.   06/07/2017 at Unknown time  . insulin degludec (TRESIBA FLEXTOUCH) 100 UNIT/ML SOPN FlexTouch Pen Inject 18 Units into the skin daily.   06/08/2017 at Unknown time  . insulin lispro (HUMALOG) 100 UNIT/ML cartridge Inject 1 Units into the skin See admin instructions. 1 unit per 15 carbs per sliding scale,adding  1 unit if BS is 150-200.   06/08/2017 at Unknown time  . levothyroxine (SYNTHROID, LEVOTHROID) 50 MCG tablet Take 50 mcg by mouth daily before breakfast.   06/07/2017 at Unknown time  . lisinopril (PRINIVIL,ZESTRIL) 10 MG tablet Take 10 mg  by mouth daily.   06/08/2017 at Unknown time  . multivitamin-lutein (OCUVITE-LUTEIN) CAPS capsule Take 1 capsule by mouth daily.   06/07/2017 at Unknown time  . mupirocin ointment (BACTROBAN) 2 % Apply 1 application topically 3 (three) times daily.   06/07/2017 at Unknown time  . Omega 3-6-9 Fatty Acids (OMEGA 3-6-9 COMPLEX PO) Take 1 capsule by mouth 2 (two) times daily.   06/07/2017 at Unknown time  . warfarin (COUMADIN) 2.5 MG tablet Take 2.5 mg by mouth See admin instructions. Takes 2.5mg  on Monday, Wednesday and Saturday.   06/06/2017  . warfarin (COUMADIN) 5 MG tablet Take 5 mg by mouth See admin instructions. Taking 5mg  on Sunday, Tuesday,Thursday, Friday.   06/07/2017 at Unknown time    Assessment: 83 yo F presented to ED 12/19 with SOB, DOE, and worsening LE edema. Pharmacy consulted to resume home warfarin for h/o Afib. INR on admission is therapeutic at 2.08. H/H and Plt wnl.   Home warfarin dose: 2.5 mg on M/W/Sat; 5 mg on other days  Goal of Therapy:  INR 2-3 Monitor platelets by anticoagulation protocol: Yes   Plan:  -Resume home warfarin regimen -Monitor daily INR and s/s of bleding  Albertina Parr, PharmD., BCPS Clinical Pharmacist Pager [phone]

## 2017-06-08 NOTE — ED Triage Notes (Signed)
Pt is here with trouble catching her breath for a couple of days.  No pain. No pain with deep breath

## 2017-06-08 NOTE — ED Provider Notes (Signed)
Emergency Department Provider Note   I have reviewed the triage vital signs and the nursing notes.   HISTORY  Chief Complaint Shortness of Breath   HPI Judy Kent is a 83 y.o. female with PMH of DM, dCHF, A fib, and HTN presents to the ED for evaluation of LE edema and progressively worsening SOB.  The patient is living in [place] for the winter with her daughter.  Her last echo was in [place] approximately 2 years prior where they appreciated some diastolic CHF but no other information is available at this time.  Patient states she began feeling short of breath this morning worse with exertion but also having some shortness of breath at rest.  She denies chest pain.  No productive cough, fevers, chills.  Patient had been on HCTZ briefly for increased leg swelling but did not continue that under the direction of her primary care physician.  She has not established care with a cardiologist here in [place].  She does not take Lasix.    Past Medical History:  Diagnosis Date  . Diabetes mellitus without complication ([place])     There are no active problems to display for this patient.   Past Surgical History:  Procedure Laterality Date  . TONSILLECTOMY        Allergies Cantaloupe (diagnostic)  No family history on file.  Social History Social History   Tobacco Use  . Smoking status: Never Smoker  . Smokeless tobacco: Never Used  Substance Use Topics  . Alcohol use: No    Frequency: Never  . Drug use: No    Review of Systems  Constitutional: No fever/chills Eyes: No visual changes. ENT: No sore throat. Cardiovascular: Denies chest pain. Respiratory: Positive shortness of breath and worsening LE edema.  Gastrointestinal: No abdominal pain.  No nausea, no vomiting.  No diarrhea.  No constipation. Genitourinary: Negative for dysuria. Musculoskeletal: Negative for back pain. Skin: Negative for rash. Neurological: Negative for headaches, focal weakness or  numbness.  10-point ROS otherwise negative.  ____________________________________________   PHYSICAL EXAM:  VITAL SIGNS: ED Triage Vitals [06/08/17 1006]  Enc Vitals Group     BP (!) 162/61     Pulse Rate 60     Resp 20     Temp 98.5 F (36.9 C)     Temp Source Oral     SpO2 93 %   Constitutional: Alert and oriented. Well appearing and in no acute distress. Eyes: Conjunctivae are normal.  Head: Atraumatic. Nose: No congestion/rhinnorhea. Mouth/Throat: Mucous membranes are moist.  Oropharynx non-erythematous. Neck: No stridor.   Cardiovascular: Normal rate, regular rhythm. Good peripheral circulation. Grossly normal heart sounds. JVP to the ankle of the mandible.  Respiratory: Increased respiratory effort with minimal exertion.  No retractions. Lungs with crackles at the bases. Gastrointestinal: Soft and nontender. No distention.  Musculoskeletal: No lower extremity tenderness but 2+ pitting edema in B/L LEs. No gross deformities of extremities. Neurologic:  Normal speech and language. No gross focal neurologic deficits are appreciated.  Skin:  Skin is warm, dry and intact. No rash noted.  ____________________________________________   LABS (all labs ordered are listed, but only abnormal results are displayed)  Labs Reviewed  BASIC METABOLIC PANEL - Abnormal; Notable for the following components:      Result Value   Sodium 129 (*)    Chloride 98 (*)    Glucose, Bld 246 (*)    BUN 21 (*)    All other components within normal limits  BRAIN  NATRIURETIC PEPTIDE - Abnormal; Notable for the following components:   B Natriuretic Peptide 317.3 (*)    All other components within normal limits  CBC  I-STAT TROPONIN, ED   ____________________________________________  EKG   EKG Interpretation  Date/Time:  Wednesday June 08 2017 10:05:10 EST Ventricular Rate:  71 PR Interval:    QRS Duration: 68 QT Interval:  400 QTC Calculation: 434 R Axis:   107 Text  Interpretation:  Atrial fibrillation Septal infarct , age undetermined Lateral infarct , age undetermined Abnormal ECG No STEMI.  Confirmed by Nanda Quinton [phone]) on 06/08/2017 11:02:30 AM       ____________________________________________  RADIOLOGY  Dg Chest 2 View  Result Date: 06/08/2017 CLINICAL DATA:  Shortness of breath for the past 2 days. EXAM: CHEST  2 VIEW COMPARISON:  None. FINDINGS: The cardiomediastinal silhouette is borderline enlarged. Atherosclerotic calcification of the aortic arch. Small bilateral pleural effusions with basal predominant increased interstitial markings and bibasilar opacities. No pneumothorax. No acute osseous abnormality. IMPRESSION: 1. Small bilateral pleural effusions with basal predominant increased interstitial markings and bibasilar opacities, favoring interstitial edema and atelectasis, respectively. 2.  Aortic atherosclerosis (ICD10-I70.0). Electronically Signed   By: Titus Dubin M.D.   On: 06/08/2017 10:30    ____________________________________________   PROCEDURES  Procedure(s) performed:   Procedures  None ____________________________________________   INITIAL IMPRESSION / ASSESSMENT AND PLAN / ED COURSE  Pertinent labs & imaging results that were available during my care of the patient were reviewed by me and considered in my medical decision making (see chart for details).  Patient presents to the emergency department for evaluation of dyspnea starting this morning without chest pain or signs of infection.  She has elevated jugular venous pulsations on exam at the bases and worsening lower extremity edema.  I am told by family that she has a history of congestive heart failure but do not have the particulars regarding the type or severity.  Patient is not on diuretics.  She has hypoxemia with minimal activity.  Plan for brief ambulation if she will tolerate as well as BNP.   01:20 PM Discussed patient's case with [facility]  to request admission. Patient and family (if present) updated with plan. Care transferred to [facility] service.  I reviewed all nursing notes, vitals, pertinent old records, EKGs, labs, imaging (as available).  ____________________________________________  FINAL CLINICAL IMPRESSION(S) / ED DIAGNOSES  Final diagnoses:  Acute on chronic congestive heart failure, unspecified heart failure type (HCC)  SOB (shortness of breath)  Hypoxia     MEDICATIONS GIVEN DURING THIS VISIT:  Medications  furosemide (LASIX) injection 40 mg (not administered)    Note:  This document was prepared using Dragon voice recognition software and may include unintentional dictation errors.  Nanda Quinton, MD Emergency Medicine    Akera Snowberger, [facility], MD 06/08/17 1340

## 2017-06-08 NOTE — ED Notes (Signed)
Dinner tray ordered at [employer]

## 2017-06-08 NOTE — H&P (Signed)
History and Physical  Judy Kent ZDG:[id number] DOB: 1927/03/29 DOA: 06/08/2017  Referring physician: ER Physician PCP: Reynold Bowen, MD  Outpatient Specialists: Cardiology and Endocrinology   Patient coming from: Home  Chief Complaint: SOB  HPI: 83 year old Caucasian female with history of DM, atrial fibrillation, diastolic CHF, HTN and TIA. Patient presents with SOB, dyspnea on exertion and worsening leg edema. No headache, no neck pain, no fever or chills, no GI symptoms and no urinary symptoms.  ED Course: Diuresed  Pertinent labs: BNP of 317 and sodium of 129. EKG: Independently reviewed. Imaging: independently reviewed.   Review of Systems:  10 systems reviewed. Negative for fever, visual changes, sore throat, rash, new muscle aches, chest pain, dysuria, bleeding, n/v/abdominal pain.  Past Medical History:  Diagnosis Date  . Diabetes mellitus without complication [facility])     Past Surgical History:  Procedure Laterality Date  . TONSILLECTOMY       reports that  has never smoked. she has never used smokeless tobacco. She reports that she does not drink alcohol or use drugs.  Allergies  Allergen Reactions  . Cantaloupe (Diagnostic) Swelling    No family history on file.   Prior to Admission medications   Medication Sig Start Date End Date Taking? Authorizing Provider  amLODipine (NORVASC) 10 MG tablet Take 10 mg by mouth daily.   Yes [provider]  amoxicillin-clavulanate (AUGMENTIN) 875-125 MG tablet Take 1 tablet by mouth 2 (two) times daily.   Yes [provider]  aspirin EC 81 MG tablet Take 81 mg by mouth daily.   Yes [provider]  Bilberry 500 MG CAPS Take 1 capsule by mouth daily.   Yes [provider]  FeAspGl-FeFum-B12-FA-C-Succ Ac (FERREX 28) MISC Take 28 mg by mouth every other day.   Yes [provider]  insulin degludec (TRESIBA FLEXTOUCH) 100 UNIT/ML SOPN FlexTouch Pen Inject 18 Units into the  skin daily.   Yes [provider]  insulin lispro (HUMALOG) 100 UNIT/ML cartridge Inject 1 Units into the skin See admin instructions. 1 unit per 15 carbs per sliding scale,adding  1 unit if BS is 150-200.   Yes [provider]  levothyroxine (SYNTHROID, LEVOTHROID) 50 MCG tablet Take 50 mcg by mouth daily before breakfast.   Yes [provider]  lisinopril (PRINIVIL,ZESTRIL) 10 MG tablet Take 10 mg by mouth daily.   Yes [provider]  multivitamin-lutein (OCUVITE-LUTEIN) CAPS capsule Take 1 capsule by mouth daily.   Yes [provider]  mupirocin ointment (BACTROBAN) 2 % Apply 1 application topically 3 (three) times daily.   Yes [provider]  Omega 3-6-9 Fatty Acids (OMEGA 3-6-9 COMPLEX PO) Take 1 capsule by mouth 2 (two) times daily.   Yes [provider]  warfarin (COUMADIN) 2.5 MG tablet Take 2.5 mg by mouth See admin instructions. Takes 2.5mg  on Monday, Wednesday and Saturday.   Yes [provider]  warfarin (COUMADIN) 5 MG tablet Take 5 mg by mouth See admin instructions. Taking 5mg  on Sunday, Tuesday,Thursday, Friday.   Yes [provider]    Physical Exam: Vitals:   06/08/17 1245 06/08/17 1300 06/08/17 1315 06/08/17 1330  BP: (!) 151/75 (!) 150/70 (!) 150/113 (!) 151/92  Pulse: 64 (!) 58 77 (!) 59  Resp: 16 18 20 19   Temp:      TempSrc:      SpO2: 94% 91% 94% 93%     Constitutional:  . Appears calm and comfortable Eyes:  . Pallor. No  jaundice.  ENMT:  . external ears, nose appear normal Neck:  . Neck is supple. No JVD Respiratory:  . Decreased air entry  Cardiovascular:  . S1S2 . LE extremity edema   Abdomen:  . Abdomen is soft and non tender. Organs are difficult to assess. Neurologic:  . Awake and alert. . Moves all limbs.  Wt Readings from Last 3 Encounters:  No data found for Wt    I have personally reviewed following labs and imaging studies  Labs on Admission:   CBC: Recent Labs  Lab 06/08/17 1014  WBC 5.8  HGB 12.3  HCT 37.0  MCV 85.3  PLT 856   Basic Metabolic Panel: Recent Labs  Lab 06/08/17 1014  NA 129*  K 4.2  CL 98*  CO2 22  GLUCOSE 246*  BUN 21*  CREATININE 0.84  CALCIUM 9.0   Liver Function Tests: No results for input(s): AST, ALT, ALKPHOS, BILITOT, PROT, ALBUMIN in the last 168 hours. No results for input(s): LIPASE, AMYLASE in the last 168 hours. No results for input(s): AMMONIA in the last 168 hours. Coagulation Profile: No results for input(s): INR, PROTIME in the last 168 hours. Cardiac Enzymes: No results for input(s): CKTOTAL, CKMB, CKMBINDEX, TROPONINI in the last 168 hours. BNP (last 3 results) No results for input(s): PROBNP in the last 8760 hours. HbA1C: No results for input(s): HGBA1C in the last 72 hours. CBG: No results for input(s): GLUCAP in the last 168 hours. Lipid Profile: No results for input(s): CHOL, HDL, LDLCALC, TRIG, CHOLHDL, LDLDIRECT in the last 72 hours. Thyroid Function Tests: No results for input(s): TSH, T4TOTAL, FREET4, T3FREE, THYROIDAB in the last 72 hours. Anemia Panel: No results for input(s): VITAMINB12, FOLATE, FERRITIN, TIBC, IRON, RETICCTPCT in the last 72 hours. Urine analysis: No results found for: COLORURINE, APPEARANCEUR, LABSPEC, PHURINE, GLUCOSEU, HGBUR, BILIRUBINUR, KETONESUR, PROTEINUR, UROBILINOGEN, NITRITE, LEUKOCYTESUR Sepsis Labs: @LABRCNTIP (procalcitonin:4,lacticidven:4) )No results found for this or any previous visit (from the past 240 hour(s)).    Radiological Exams on Admission: Dg Chest 2 View  Result Date: 06/08/2017 CLINICAL DATA:  Shortness of breath for the past 2 days. EXAM: CHEST  2 VIEW COMPARISON:  None. FINDINGS: The cardiomediastinal silhouette is borderline enlarged. Atherosclerotic calcification of the aortic arch. Small bilateral pleural effusions with basal predominant increased interstitial markings and bibasilar opacities. No  pneumothorax. No acute osseous abnormality. IMPRESSION: 1. Small bilateral pleural effusions with basal predominant increased interstitial markings and bibasilar opacities, favoring interstitial edema and atelectasis, respectively. 2.  Aortic atherosclerosis (ICD10-I70.0). Electronically Signed   By: Titus Dubin M.D.   On: 06/08/2017 10:30    EKG: Independently reviewed.   Active Problems:   * No active hospital problems. *   Assessment/Plan 1. Acute on chronic diastolic CHF 2. Hypertension 3. DM 4. Atrial fibrillation   Admit patient  Diurese patient  Beta blocker  ECHO  Optimize DM  Manage Coumadin  Further management will depend on hospital course.  DVT prophylaxis:On coumadin Code Status: Full Family Communication:  Disposition Plan: Home   Consults called: Low threshold to consult cardiology   Admission status: Inpatient    Time spent: 65 minutes  Dana Allan, MD  [facility] Pager #: [phone] 7PM-7AM contact night coverage as above   06/08/2017, 2:36 PM

## 2017-06-08 NOTE — ED Notes (Signed)
Pt eating meal tray at this time, pt will be transported to inpt unit upon completion.

## 2017-06-08 NOTE — ED Notes (Signed)
Pt assisted to bedside commode. Her hgb was attempted to be collected x1 prior to being given lunch. She is currently eating (with daughter at bedside).

## 2017-06-08 NOTE — ED Notes (Signed)
CBG taken at 19:10 was 208. (Dinner tray still not here, but daughter is at bedside and has brought her mother's insuline).

## 2017-06-09 ENCOUNTER — Inpatient Hospital Stay (HOSPITAL_COMMUNITY): Payer: Medicare HMO

## 2017-06-09 DIAGNOSIS — I482 Chronic atrial fibrillation: Secondary | ICD-10-CM

## 2017-06-09 DIAGNOSIS — I10 Essential (primary) hypertension: Secondary | ICD-10-CM

## 2017-06-09 DIAGNOSIS — I361 Nonrheumatic tricuspid (valve) insufficiency: Secondary | ICD-10-CM

## 2017-06-09 DIAGNOSIS — I509 Heart failure, unspecified: Secondary | ICD-10-CM

## 2017-06-09 DIAGNOSIS — E118 Type 2 diabetes mellitus with unspecified complications: Secondary | ICD-10-CM

## 2017-06-09 LAB — RENAL FUNCTION PANEL
Albumin: 3.2 g/dL — ABNORMAL LOW (ref 3.5–5.0)
Anion gap: 7 (ref 5–15)
BUN: 19 mg/dL (ref 6–20)
CO2: 29 mmol/L (ref 22–32)
Calcium: 9 mg/dL (ref 8.9–10.3)
Chloride: 97 mmol/L — ABNORMAL LOW (ref 101–111)
Creatinine, Ser: 0.84 mg/dL (ref 0.44–1.00)
GFR calc Af Amer: >60 mL/min (ref >60)
GFR calc non Af Amer: >60 mL/min (ref >60)
Glucose, Bld: 212 mg/dL — ABNORMAL HIGH (ref 65–99)
Phosphorus: 3.2 mg/dL (ref 2.5–4.6)
Potassium: 3.7 mmol/L (ref 3.5–5.1)
Sodium: 133 mmol/L — ABNORMAL LOW (ref 135–145)

## 2017-06-09 LAB — GLUCOSE, CAPILLARY
Glucose-Capillary: 147 mg/dL — ABNORMAL HIGH (ref 65–99)
Glucose-Capillary: 280 mg/dL — ABNORMAL HIGH (ref 65–99)
Glucose-Capillary: 224 mg/dL — ABNORMAL HIGH (ref 65–99)
Glucose-Capillary: 277 mg/dL — ABNORMAL HIGH (ref 65–99)

## 2017-06-09 LAB — TROPONIN I
Troponin I: <0.03 ng/mL (ref <0.03)
Troponin I: <0.03 ng/mL (ref <0.03)

## 2017-06-09 LAB — PROTIME-INR
INR: 2.16
Prothrombin Time: 23.9 seconds — ABNORMAL HIGH (ref 11.4–15.2)

## 2017-06-09 LAB — ECHOCARDIOGRAM COMPLETE
Height: 61 in
Weight: 1928 oz

## 2017-06-09 MED ORDER — INSULIN DEGLUDEC 100 UNIT/ML ~~LOC~~ SOPN: 18.0000 [IU] | PEN_INJECTOR | Freq: Every day | SUBCUTANEOUS | Status: DC

## 2017-06-09 MED ORDER — CARVEDILOL 6.25 MG PO TABS
6.2500 mg | ORAL_TABLET | Freq: Two times a day (BID) | ORAL | Status: DC
  Administered 2017-06-10: 6.25 mg via ORAL
  Filled 2017-06-09: qty 1

## 2017-06-09 MED ORDER — INSULIN GLARGINE 100 UNIT/ML ~~LOC~~ SOLN
18.0000 [IU] | Freq: Every day | SUBCUTANEOUS | Status: DC
  Administered 2017-06-09: 18 [IU] via SUBCUTANEOUS
  Filled 2017-06-09 (×2): qty 0.18

## 2017-06-09 MED ORDER — PROSIGHT PO TABS
1.0000 | ORAL_TABLET | Freq: Every day | ORAL | Status: DC
  Administered 2017-06-09 – 2017-06-10 (×2): 1 via ORAL
  Filled 2017-06-09 (×2): qty 1

## 2017-06-09 NOTE — Progress Notes (Signed)
  Echocardiogram 2D Echocardiogram has been performed.  Judy Kent 06/09/2017, 2:23 PM

## 2017-06-09 NOTE — Progress Notes (Signed)
ANTICOAGULATION CONSULT NOTE - Follow Up Consult  Pharmacy Consult for Coumadin Indication: atrial fibrillation  Patient Measurements: Height: 5\' 1"  (154.9 cm) Weight: 120 lb 8 oz (54.7 kg) IBW/kg (Calculated) : 47.8   Vital Signs: Temp: 98.2 F (36.8 C) (12/20 0425) Temp Source: Oral (12/20 0425) BP: 149/48 (12/20 1127) Pulse Rate: 58 (12/20 1127)  Labs: Recent Labs    06/08/17 1014 06/08/17 2127 06/09/17 0238 06/09/17 0851  HGB 12.3  --   --   --   HCT 37.0  --   --   --   PLT 217  --   --   --   LABPROT  --  23.2*  --  23.9*  INR  --  2.08  --  2.16  CREATININE 0.84 0.97 0.84  --   TROPONINI  --  <0.03 <0.03 <0.03    Estimated Creatinine Clearance: 34.9 mL/min (by C-G formula based on SCr of 0.84 mg/dL).  Assessment:   83 yr old female continues on Coumadin for afib.   INR remains therapeutic (2.16) on home regimen of 2.5 mg on MWSat and 5 mg TTFSun.  Goal of Therapy:  INR 2-3 Monitor platelets by anticoagulation protocol: Yes   Plan:   Continue home Coumadin regimen: 5 mg due today.  Daily PT/INR for now.  Arty Baumgartner. RPh Pager: [phone] 06/09/2017,4:09 PM

## 2017-06-09 NOTE — Progress Notes (Addendum)
PROGRESS NOTE [facility]   Creedence Heiss   LFY:[id number] DOB: 1926-09-06  DOA: 06/08/2017 PCP: Reynold Bowen, MD   Brief Narrative:  Judy Kent is a 83 year old female with medical history of diabetes, A. fib, diastolic heart failure, hypertension and TIAs presented to the emergency department complaining of shortness of breath on exertion and worsening lower extremity edema.  Upon ED evaluation chest x-ray increasing vascular congestion and pulmonary edema.  Lower extremity edema and patient was admitted with working diagnosis of CHF exacerbation.  Patient was started on IV Lasix and echocardiogram was ordered.  Subjective: Patient seen and examined, report significantly improved on breathing and leg swelling.  Today denies chest pain, palpitations and dizziness.  No acute events overnight.  Assessment & Plan: Acute on chronic diastolic heart failure No records available for echocardiogram, but patient not on CHF medications so presume this is a diastolic dysfunction. Echo cardiogram pending Continue IV diuresis Patient was started in Coreg on admission, will increase dose as BP has been elevated  Daily weights and I&O's Low-salt diet Monitor creatinine closely while on Lasix. Wean O2 as tolerated  I have requested her cardiologist records as patient is form [place]   Acute respiratory failure with hypoxia  Due pulmonary edema  Treat underlying cause   Chronic A. fib Rate controlled, on warfarin INR therapeutic Continue to monitor  HTN  BP has been elevated during hospital stay Patient on lisinopril and Coreg - coreg increased  Continue to monitor  DM type 2 CBG's slight above goal  Tyler Aas was on hold - will resume  Continue SSI  Monitor  Check A1C   Hypothyroidism  Continue synthroid   DVT prophylaxis: Warfarin  Code Status: Stable  Family Communication: Daughter at bedside  Disposition Plan: Home in the next 24-48 hrs   Consultants:   None    Procedures:   ECHO pending  Antimicrobials:  Augmentin PTA - can d/c now     Objective: Vitals:   06/09/17 0425 06/09/17 0609 06/09/17 0656 06/09/17 1127  BP: (!) 181/67 (!) 162/77 (!) 156/84 (!) 149/48  Pulse: 69 72 65 (!) 58  Resp: 20  20   Temp: 98.2 F (36.8 C)     TempSrc: Oral     SpO2: 94%  99% 98%  Weight: 54.7 kg (120 lb 8 oz)     Height:        Intake/Output Summary (Last 24 hours) at 06/09/2017 1618 Last data filed at 06/09/2017 1339 Gross per 24 hour  Intake 840 ml  Output 3900 ml  Net -3060 ml   Filed Weights   06/08/17 2025 06/09/17 0425  Weight: 57.4 kg (126 lb 9.6 oz) 54.7 kg (120 lb 8 oz)    Examination:  General exam: Appears calm and comfortable  HEENT: OP moist and clear + JVD  Respiratory system: Good air entry, bibasilar crackles no wheezing  Cardiovascular system: E5I7 Irr Irr, systolic murmur  Gastrointestinal system: Abdomen is nondistended, soft and nontender.  Central nervous system: Alert and oriented. No focal neurological deficits. Extremities: b/l LE pitting edema 2+  Skin: No rashes, lesions or ulcers Psychiatry: Judgement and insight appear normal. Mood & affect appropriate.    Data Reviewed: I have personally reviewed following labs and imaging studies  CBC: Recent Labs  Lab 06/08/17 1014  WBC 5.8  HGB 12.3  HCT 37.0  MCV 85.3  PLT 782   Basic Metabolic Panel: Recent Labs  Lab 06/08/17 1014 06/08/17 2127 06/09/17 0238  NA 129*  136 133*  K 4.2 4.3 3.7  CL 98* 101 97*  CO2 22 26 29   GLUCOSE 246* 83 212*  BUN 21* 21* 19  CREATININE 0.84 0.97 0.84  CALCIUM 9.0 9.3 9.0  MG  --  1.9  --   PHOS  --   --  3.2   GFR: Estimated Creatinine Clearance: 34.9 mL/min (by C-G formula based on SCr of 0.84 mg/dL). Liver Function Tests: Recent Labs  Lab 06/09/17 0238  ALBUMIN 3.2*   No results for input(s): LIPASE, AMYLASE in the last 168 hours. No results for input(s): AMMONIA in the last 168 hours. Coagulation  Profile: Recent Labs  Lab 06/08/17 2127 06/09/17 0851  INR 2.08 2.16   Cardiac Enzymes: Recent Labs  Lab 06/08/17 2127 06/09/17 0238 06/09/17 0851  TROPONINI <0.03 <0.03 <0.03   BNP (last 3 results) No results for input(s): PROBNP in the last 8760 hours. HbA1C: Recent Labs    06/08/17 1443  HGBA1C 7.9*   CBG: Recent Labs  Lab 06/08/17 1909 06/08/17 2346 06/09/17 0721 06/09/17 1126  GLUCAP 208* 207* 147* 280*   Lipid Profile: No results for input(s): CHOL, HDL, LDLCALC, TRIG, CHOLHDL, LDLDIRECT in the last 72 hours. Thyroid Function Tests: No results for input(s): TSH, T4TOTAL, FREET4, T3FREE, THYROIDAB in the last 72 hours. Anemia Panel: No results for input(s): VITAMINB12, FOLATE, FERRITIN, TIBC, IRON, RETICCTPCT in the last 72 hours. Sepsis Labs: No results for input(s): PROCALCITON, LATICACIDVEN in the last 168 hours.  No results found for this or any previous visit (from the past 240 hour(s)).    Radiology Studies: Dg Chest 2 View  Result Date: 06/08/2017 CLINICAL DATA:  Shortness of breath for the past 2 days. EXAM: CHEST  2 VIEW COMPARISON:  None. FINDINGS: The cardiomediastinal silhouette is borderline enlarged. Atherosclerotic calcification of the aortic arch. Small bilateral pleural effusions with basal predominant increased interstitial markings and bibasilar opacities. No pneumothorax. No acute osseous abnormality. IMPRESSION: 1. Small bilateral pleural effusions with basal predominant increased interstitial markings and bibasilar opacities, favoring interstitial edema and atelectasis, respectively. 2.  Aortic atherosclerosis (ICD10-I70.0). Electronically Signed   By: Titus Dubin M.D.   On: 06/08/2017 10:30      Scheduled Meds: . amLODipine  10 mg Oral Daily  . amoxicillin-clavulanate  1 tablet Oral BID  . aspirin EC  81 mg Oral Daily  . carvedilol  3.125 mg Oral BID WC  . furosemide  40 mg Intravenous Q12H  . insulin aspart  0-9 Units  Subcutaneous TID WC  . levothyroxine  50 mcg Oral QAC breakfast  . lisinopril  10 mg Oral Daily  . multivitamin  1 tablet Oral Daily  . warfarin  2.5 mg Oral Once per day on Mon Wed Sat  . warfarin  5 mg Oral Once per day on Sun Tue Thu Fri  . Warfarin - Pharmacist Dosing Inpatient   Does not apply q1800   Continuous Infusions:   LOS: 1 day   Time spent: Total of 25 minutes spent with pt, greater than 50% of which was spent in discussion of  treatment, counseling and coordination of care  Chipper Oman, MD Pager: Text Page via [email or web address]   If 7PM-7AM, please contact night-coverage [email or web address] 06/09/2017, 4:18 PM

## 2017-06-09 NOTE — Progress Notes (Signed)
Patient had a 2.66 sinus pause. Patient is asymptomatic and resting. Vitals are stable and MD has been notified. Will continue to monitor.   Drue Flirt, RN

## 2017-06-09 NOTE — Progress Notes (Signed)
Inpatient Diabetes Program Recommendations  AACE/ADA: New Consensus Statement on Inpatient Glycemic Control (2015)  Target Ranges:  Prepandial:   less than 140 mg/dL      Peak postprandial:   less than 180 mg/dL (1-2 hours)      Critically ill patients:  140 - 180 mg/dL   Lab Results  Component Value Date   GLUCAP 280 (H) 06/09/2017   HGBA1C 7.9 (H) 06/08/2017    Review of Glycemic ControlResults for Judy Kent, Judy Kent (MRN [id number]) as of 06/09/2017 16:09  Ref. Range 06/08/2017 19:09 06/08/2017 23:46 06/09/2017 07:21 06/09/2017 11:26  Glucose-Capillary Latest Ref Range: 65 - 99 mg/dL 208 (H) 207 (H) 147 (H) 280 (H)    Diabetes history: Type 2 DM Outpatient Diabetes medications:  Tresiba 18 units daily, Humalog 1 unit/15 grams of CHO Current orders for Inpatient glycemic control:  Novolog sensitive tid with meals  Inpatient Diabetes Program Recommendations:    If appropriate, may consider adding Lantus 8 units daily and Novolog 3 units tid with meals (if patient eats >50%).   Thanks, Adah Perl, RN, BC-ADM Inpatient Diabetes Coordinator Pager [phone] (8a-5p)

## 2017-06-09 NOTE — Progress Notes (Signed)
Patient's heart rate has been dropping into the 40's, with the lowest being a HR of 34. The patient is asymptomatic with no complaints of dizziness, or SOB at this time. MD has been notified. Will continue to monitor.   Drue Flirt, RN

## 2017-06-10 DIAGNOSIS — R0902 Hypoxemia: Secondary | ICD-10-CM

## 2017-06-10 LAB — BASIC METABOLIC PANEL
Anion gap: 8 (ref 5–15)
BUN: 20 mg/dL (ref 6–20)
CO2: 30 mmol/L (ref 22–32)
Calcium: 8.9 mg/dL (ref 8.9–10.3)
Chloride: 97 mmol/L — ABNORMAL LOW (ref 101–111)
Creatinine, Ser: 0.9 mg/dL (ref 0.44–1.00)
GFR calc Af Amer: >60 mL/min (ref >60)
GFR calc non Af Amer: 55 mL/min — ABNORMAL LOW (ref >60)
Glucose, Bld: 205 mg/dL — ABNORMAL HIGH (ref 65–99)
Potassium: 3.2 mmol/L — ABNORMAL LOW (ref 3.5–5.1)
Sodium: 135 mmol/L (ref 135–145)

## 2017-06-10 LAB — PROTIME-INR
INR: 2.49
Prothrombin Time: 26.7 seconds — ABNORMAL HIGH (ref 11.4–15.2)

## 2017-06-10 LAB — GLUCOSE, CAPILLARY
Glucose-Capillary: 135 mg/dL — ABNORMAL HIGH (ref 65–99)
Glucose-Capillary: 290 mg/dL — ABNORMAL HIGH (ref 65–99)

## 2017-06-10 MED ORDER — CARVEDILOL 6.25 MG PO TABS: 6.2500 mg | ORAL_TABLET | Freq: Two times a day (BID) | ORAL | 0 refills | Status: DC

## 2017-06-10 MED ORDER — INSULIN DEGLUDEC 100 UNIT/ML ~~LOC~~ SOPN: 20.0000 [IU] | PEN_INJECTOR | Freq: Every day | SUBCUTANEOUS | Status: DC

## 2017-06-10 MED ORDER — POTASSIUM CHLORIDE CRYS ER 20 MEQ PO TBCR
40.0000 meq | EXTENDED_RELEASE_TABLET | ORAL | Status: AC
  Administered 2017-06-10 (×2): 40 meq via ORAL
  Filled 2017-06-10 (×2): qty 2

## 2017-06-10 MED ORDER — FUROSEMIDE 20 MG PO TABS: 20.0000 mg | ORAL_TABLET | Freq: Every day | ORAL | 0 refills | Status: DC

## 2017-06-10 NOTE — Progress Notes (Signed)
Inpatient Diabetes Program Recommendations  AACE/ADA: New Consensus Statement on Inpatient Glycemic Control (2015)  Target Ranges:  Prepandial:   less than 140 mg/dL      Peak postprandial:   less than 180 mg/dL (1-2 hours)      Critically ill patients:  140 - 180 mg/dL   Results for NASRIN, LANZO (MRN [id number]) as of 06/10/2017 12:22  Ref. Range 06/09/2017 07:21 06/09/2017 11:26 06/09/2017 16:17 06/09/2017 19:55  Glucose-Capillary Latest Ref Range: 65 - 99 mg/dL 147 (H) 280 (H) 224 (H) 277 (H)   Results for LINELL, MELDRUM (MRN [id number]) as of 06/10/2017 12:22  Ref. Range 06/10/2017 07:21 06/10/2017 11:43  Glucose-Capillary Latest Ref Range: 65 - 99 mg/dL 135 (H) 290 (H)   Home DM Meds: Tresiba 18 units daily       Humalog 1 unit for every 15 grams carbohydrates  Current Insulin Orders: Lantus 18 units QHS      Novolog Sensitive Correction Scale/ SSI (0-9 units) TID AC        MD- Patient eating 100% of meals.  Takes Humalog with meals at home.  Please consider starting Novolog Meal Coverage:  Novolog 4 units TID with meals (hold if pt eats <50% of meal)      --Will follow patient during hospitalization--  Wyn Quaker RN, MSN, CDE Diabetes Coordinator Inpatient Glycemic Control Team Team Pager: [phone] (8a-5p)

## 2017-06-10 NOTE — Progress Notes (Signed)
Patient's heart rate dropped down to 34. Patient is asymptomatic and vitals are normal. MD notified, will continue to monitor.   Drue Flirt, RN

## 2017-06-10 NOTE — Progress Notes (Signed)
ANTICOAGULATION CONSULT NOTE - Follow Up Consult  Pharmacy Consult for Coumadin Indication: atrial fibrillation  Patient Measurements: Height: 5\' 1"  (154.9 cm) Weight: 114 lb 14.4 oz (52.1 kg) IBW/kg (Calculated) : 47.8   Vital Signs: Temp: 98.3 F (36.8 C) (12/21 0532) Temp Source: Oral (12/21 0532) BP: 138/50 (12/21 0532) Pulse Rate: 61 (12/21 0532)  Labs: Recent Labs    06/08/17 1014 06/08/17 2127 06/09/17 0238 06/09/17 0851 06/10/17 0550  HGB 12.3  --   --   --   --   HCT 37.0  --   --   --   --   PLT 217  --   --   --   --   LABPROT  --  23.2*  --  23.9* 26.7*  INR  --  2.08  --  2.16 2.49  CREATININE 0.84 0.97 0.84  --  0.90  TROPONINI  --  <0.03 <0.03 <0.03  --     Estimated Creatinine Clearance: 32.6 mL/min (by C-G formula based on SCr of 0.9 mg/dL).  Assessment: 83 yr old female continues on Coumadin for afib. Home regimen of 2.5mg  on MWSat and 5mg  TThFSun. Admitted with therapeutic INR.  INR remains in range today at 2.49. No bleeding noted.  Goal of Therapy:  INR 2-3 Monitor platelets by anticoagulation protocol: Yes   Plan:  Continue home warfarin dosing- 2.5mg  on MWSat and 5mg  on TThFSun Daily INR for now- can go to three times weekly if remains stable CBC in the morning  Abigael Mogle D. Fritzi Scripter, PharmD, BCPS Clinical Pharmacist Clinical Phone for 06/10/2017 until 3:30pm: [phone] If after 3:30pm, please call main pharmacy at [phone] 06/10/2017 10:54 AM

## 2017-06-10 NOTE — Discharge Summary (Signed)
Physician Discharge Summary  Judy Kent  FWY:[id number]  DOB: 12-Mar-1927  DOA: 06/08/2017 PCP: Reynold Bowen, MD  Admit date: 06/08/2017 Discharge date: 06/10/2017  Admitted From: Home  Disposition:  Home   Recommendations for Outpatient Follow-up:  1. Follow up with PCP in 1-2 weeks 2. Please obtain BMP/CBC in one week to monitor Hgb and Cr  3. Patient started on Lasix   Discharge Condition: Stable  CODE STATUS: Full Code  Diet recommendation: Heart Healthy  Brief/Interim Summary: Judy Kent is a 83 year old female with medical history of diabetes, A. fib, diastolic heart failure, hypertension and TIAs presented to the emergency department complaining of shortness of breath on exertion and worsening lower extremity edema.  Upon ED evaluation chest x-ray increasing vascular congestion and pulmonary edema.  Lower extremity edema and patient was admitted with working diagnosis of CHF exacerbation.  Patient was started on IV Lasix with great diuresis, ~ negative 6L and decrease ~ 6Lb during hospital stay. Echocardiogram shows EF 60-65% no WMA and right atrium dilated consisted with diastolic dysfunction. Patient was weaned off oxygen, ambulating w/o issues and clinically improved. Patient was discharge home on low dose Lasix to follow up with PCP in 1-2 weeks.  Subjective: Patient seen and examined, her breathing is back to normal, Leg swelling decreased significantly. Patient stated that she is back to her regular weight which is ~ 115.    Discharge Diagnoses/Hospital Course:  Acute on chronic diastolic heart failure Echocardiogram shows EF 60-65% no WMA and right atrium dilated consisted with diastolic dysfunction. She was treated with IV Lasix, will d/c on low dose Lasix daily for now, check weight daily  Great diuresis, - 6 L, weight is down to dry weight ~115 Patient was started on Coreg as BP was above goal  Low-salt diet Off O2  Check BMP in 1 week   Acute  respiratory failure with hypoxia - Resolved  Due pulmonary edema  Treat underlying cause   Chronic A. fib Rate controlled, on warfarin INR therapeutic Continue to monitor  HTN  BP stable  Patient on Norvasc, lisinopril and Patient was started on Coreg  Continue to monitor  DM type 2 CBG's slight above goal  Will increase Tresiba to 20 units daily  Continue home novolog  A1C 7.9 Follow up with outpatient Endo   Hypothyroidism  Continue synthroid   All other chronic medical condition were stable during the hospitalization. On the day of the discharge the patient's vitals were stable, and no other acute medical condition were reported by patient. the patient was felt safe to be discharge to home   Discharge Instructions  You were cared for by a hospitalist during your hospital stay. If you have any questions about your discharge medications or the care you received while you were in the hospital after you are discharged, you can call the unit and asked to speak with the hospitalist on call if the hospitalist that took care of you is not available. Once you are discharged, your primary care physician will handle any further medical issues. Please note that NO REFILLS for any discharge medications will be authorized once you are discharged, as it is imperative that you return to your primary care physician (or establish a relationship with a primary care physician if you do not have one) for your aftercare needs so that they can reassess your need for medications and monitor your lab values.  Discharge Instructions    Call MD for:  difficulty breathing, headache or visual  disturbances   Complete by:  As directed    Call MD for:  extreme fatigue   Complete by:  As directed    Call MD for:  hives   Complete by:  As directed    Call MD for:  persistant dizziness or light-headedness   Complete by:  As directed    Call MD for:  persistant nausea and vomiting   Complete by:  As  directed    Call MD for:  redness, tenderness, or signs of infection (pain, swelling, redness, odor or green/yellow discharge around incision site)   Complete by:  As directed    Call MD for:  severe uncontrolled pain   Complete by:  As directed    Call MD for:  temperature >100.4   Complete by:  As directed    Diet - low sodium heart healthy   Complete by:  As directed    Increase activity slowly   Complete by:  As directed      Allergies as of 06/10/2017      Reactions   Cantaloupe (diagnostic) Swelling      Medication List    STOP taking these medications   amoxicillin-clavulanate 875-125 MG tablet Commonly known as:  AUGMENTIN     TAKE these medications   amLODipine 10 MG tablet Commonly known as:  NORVASC Take 10 mg by mouth daily.   aspirin EC 81 MG tablet Take 81 mg by mouth daily.   Bilberry 500 MG Caps Take 1 capsule by mouth daily.   carvedilol 6.25 MG tablet Commonly known as:  COREG Take 1 tablet (6.25 mg total) by mouth 2 (two) times daily with a meal.   FERREX 28 Misc Take 28 mg by mouth every other day.   furosemide 20 MG tablet Commonly known as:  LASIX Take 1 tablet (20 mg total) by mouth daily. Take 2 tablets if increase in weight above 5 Lb   HUMALOG 100 UNIT/ML cartridge Generic drug:  insulin lispro Inject 1 Units into the skin See admin instructions. 1 unit per 15 carbs per sliding scale,adding  1 unit if BS is 150-200.   insulin degludec 100 UNIT/ML Sopn FlexTouch Pen Commonly known as:  TRESIBA FLEXTOUCH Inject 0.2 mLs (20 Units total) into the skin daily. What changed:  how much to take   levothyroxine 50 MCG tablet Commonly known as:  SYNTHROID, LEVOTHROID Take 50 mcg by mouth daily before breakfast.   lisinopril 10 MG tablet Commonly known as:  PRINIVIL,ZESTRIL Take 10 mg by mouth daily.   multivitamin-lutein Caps capsule Take 1 capsule by mouth daily.   mupirocin ointment 2 % Commonly known as:  BACTROBAN Apply 1  application topically 3 (three) times daily.   OMEGA 3-6-9 COMPLEX PO Take 1 capsule by mouth 2 (two) times daily.   warfarin 2.5 MG tablet Commonly known as:  COUMADIN Take 2.5 mg by mouth See admin instructions. Takes 2.5mg  on Monday, Wednesday and Saturday.   warfarin 5 MG tablet Commonly known as:  COUMADIN Take 5 mg by mouth See admin instructions. Taking 5mg  on Sunday, Tuesday,Thursday, Friday.      Follow-up Information    Reynold Bowen, MD. Schedule an appointment as soon as possible for a visit in 1 week(s).   Specialty:  Endocrinology Why:  Hospital follow up  Contact information: [place] [address] [phone]          Allergies  Allergen Reactions  . Cantaloupe (Diagnostic) Swelling    Consultations:  None    Procedures/Studies: Dg Chest 2 View  Result Date: 06/08/2017 CLINICAL DATA:  Shortness of breath for the past 2 days. EXAM: CHEST  2 VIEW COMPARISON:  None. FINDINGS: The cardiomediastinal silhouette is borderline enlarged. Atherosclerotic calcification of the aortic arch. Small bilateral pleural effusions with basal predominant increased interstitial markings and bibasilar opacities. No pneumothorax. No acute osseous abnormality. IMPRESSION: 1. Small bilateral pleural effusions with basal predominant increased interstitial markings and bibasilar opacities, favoring interstitial edema and atelectasis, respectively. 2.  Aortic atherosclerosis (ICD10-I70.0). Electronically Signed   By: Titus Dubin M.D.   On: 06/08/2017 10:30   ECHO 06/09/17 ------------------------------------------------------------------- Study Conclusions  - Left ventricle: The cavity size was normal. There was mild   concentric hypertrophy. Systolic function was normal. The   estimated ejection fraction was in the range of 60% to 65%. Wall   motion was normal; there were no regional wall motion   abnormalities. Doppler parameters are consistent with    indeterminate ventricular filling pressure. - Aortic valve: Transvalvular velocity was within the normal range.   There was no stenosis. There was no regurgitation. - Mitral valve: Transvalvular velocity was within the normal range.   There was no evidence for stenosis. There was no regurgitation. - Left atrium: The atrium was severely dilated. - Right ventricle: The cavity size was normal. Wall thickness was   normal. Systolic function was normal. - Right atrium: The atrium was moderately dilated. - Tricuspid valve: There was moderate regurgitation. - Pulmonary arteries: Systolic pressure was mildly increased. PA   peak pressure: 47 mm Hg (S).    Discharge Exam: Vitals:   06/10/17 0532 06/10/17 1144  BP: (!) 138/50 (!) 140/52  Pulse: 61 (!) 59  Resp: 20 18  Temp: 98.3 F (36.8 C) 98.3 F (36.8 C)  SpO2: 100% 94%   Vitals:   06/09/17 1958 06/09/17 2307 06/10/17 0532 06/10/17 1144  BP: (!) 143/61 137/66 (!) 138/50 (!) 140/52  Pulse: (!) 55 68 61 (!) 59  Resp: 20  20 18   Temp: 97.9 F (36.6 C)  98.3 F (36.8 C) 98.3 F (36.8 C)  TempSrc: Oral  Oral Oral  SpO2: 96% 96% 100% 94%  Weight:   52.1 kg (114 lb 14.4 oz)   Height:        General: Pt is alert, awake, not in acute distress Cardiovascular: RRR, S1/S2 +, no rubs, no gallops Respiratory: CTA bilaterally, no wheezing, no rhonchi Abdominal: Soft, NT, ND, bowel sounds + Extremities: no edema, no cyanosis  The results of significant diagnostics from this hospitalization (including imaging, microbiology, ancillary and laboratory) are listed below for reference.     Microbiology: No results found for this or any previous visit (from the past 240 hour(s)).   Labs: BNP (last 3 results) Recent Labs    06/08/17 1014  BNP 854.6*   Basic Metabolic Panel: Recent Labs  Lab 06/08/17 1014 06/08/17 2127 06/09/17 0238 06/10/17 0550  NA 129* 136 133* 135  K 4.2 4.3 3.7 3.2*  CL 98* 101 97* 97*  CO2 22 26 29 30    GLUCOSE 246* 83 212* 205*  BUN 21* 21* 19 20  CREATININE 0.84 0.97 0.84 0.90  CALCIUM 9.0 9.3 9.0 8.9  MG  --  1.9  --   --   PHOS  --   --  3.2  --    Liver Function Tests: Recent Labs  Lab 06/09/17 0238  ALBUMIN 3.2*   No results for input(s): LIPASE, AMYLASE  in the last 168 hours. No results for input(s): AMMONIA in the last 168 hours. CBC: Recent Labs  Lab 06/08/17 1014  WBC 5.8  HGB 12.3  HCT 37.0  MCV 85.3  PLT 217   Cardiac Enzymes: Recent Labs  Lab 06/08/17 2127 06/09/17 0238 06/09/17 0851  TROPONINI <0.03 <0.03 <0.03   BNP: Invalid input(s): POCBNP CBG: Recent Labs  Lab 06/09/17 1126 06/09/17 1617 06/09/17 1955 06/10/17 0721 06/10/17 1143  GLUCAP 280* 224* 277* 135* 290*   D-Dimer No results for input(s): DDIMER in the last 72 hours. Hgb A1c Recent Labs    06/08/17 1443  HGBA1C 7.9*   Lipid Profile No results for input(s): CHOL, HDL, LDLCALC, TRIG, CHOLHDL, LDLDIRECT in the last 72 hours. Thyroid function studies No results for input(s): TSH, T4TOTAL, T3FREE, THYROIDAB in the last 72 hours.  Invalid input(s): FREET3 Anemia work up No results for input(s): VITAMINB12, FOLATE, FERRITIN, TIBC, IRON, RETICCTPCT in the last 72 hours. Urinalysis No results found for: COLORURINE, APPEARANCEUR, LABSPEC, Reading, GLUCOSEU, HGBUR, BILIRUBINUR, KETONESUR, PROTEINUR, UROBILINOGEN, NITRITE, LEUKOCYTESUR Sepsis Labs Invalid input(s): PROCALCITONIN,  WBC,  LACTICIDVEN Microbiology No results found for this or any previous visit (from the past 240 hour(s)).   Time coordinating discharge: 35 minutes  SIGNED:  Chipper Oman, MD  [facility] 06/10/2017, 1:09 PM  Pager please text page via  [email or web address] Password [email or web address]

## 2017-06-10 NOTE — Care Management Note (Signed)
Case Management Note  Patient Details  Name: Judy Kent MRN: [id number] Date of Birth: May 18, 1927  Subjective/Objective: CHF                  Action/Plan: Pt for discharge today.  In to speak with PT, daughter at bedside. Prior to admission Pt lived home with daughter.  PCP Noted.  Home DME equipment includes: 3 in 1, Shower chair. Uses [facility] on [address], [place], [place].  Pt denies inability to afford medications or Food/Nutrition.  Pt uses daughter for transportation  to go shopping and medical appointments. No discharge needs noted.  Expected Discharge Date:  06/10/17               Expected Discharge Plan:  Home/Self Care  Discharge planning Services  CM Consult  Status of Service:  Completed, signed off  Kristen Cardinal, RN  Case Manager-Orientation [phone] 06/10/2017, 2:44 PM

## 2017-06-10 NOTE — Progress Notes (Signed)
Pt has orders to be discharged. Discharge instructions given and pt has no additional questions at this time. Medication regimen reviewed and pt educated. Pt verbalized understanding and has no additional questions. Handout for coreg given to pt and family. Telemetry box removed. IV removed and site in good condition. Pt stable and waiting for transportation.

## 2017-06-23 DIAGNOSIS — I509 Heart failure, unspecified: Secondary | ICD-10-CM | POA: Diagnosis not present

## 2017-06-27 DIAGNOSIS — M79674 Pain in right toe(s): Secondary | ICD-10-CM | POA: Diagnosis not present

## 2017-06-27 DIAGNOSIS — I739 Peripheral vascular disease, unspecified: Secondary | ICD-10-CM | POA: Diagnosis not present

## 2017-06-27 DIAGNOSIS — B351 Tinea unguium: Secondary | ICD-10-CM | POA: Diagnosis not present

## 2017-06-27 DIAGNOSIS — E1151 Type 2 diabetes mellitus with diabetic peripheral angiopathy without gangrene: Secondary | ICD-10-CM | POA: Diagnosis not present

## 2017-06-29 DIAGNOSIS — L02425 Furuncle of right lower limb: Secondary | ICD-10-CM | POA: Diagnosis not present

## 2017-06-29 DIAGNOSIS — B9689 Other specified bacterial agents as the cause of diseases classified elsewhere: Secondary | ICD-10-CM | POA: Diagnosis not present

## 2017-06-29 DIAGNOSIS — E109 Type 1 diabetes mellitus without complications: Secondary | ICD-10-CM | POA: Diagnosis not present

## 2017-06-30 DIAGNOSIS — Z7901 Long term (current) use of anticoagulants: Secondary | ICD-10-CM | POA: Diagnosis not present

## 2017-07-06 DIAGNOSIS — I10 Essential (primary) hypertension: Secondary | ICD-10-CM | POA: Diagnosis not present

## 2017-07-06 DIAGNOSIS — I872 Venous insufficiency (chronic) (peripheral): Secondary | ICD-10-CM | POA: Diagnosis not present

## 2017-07-06 DIAGNOSIS — I482 Chronic atrial fibrillation: Secondary | ICD-10-CM | POA: Diagnosis not present

## 2017-07-06 DIAGNOSIS — I5033 Acute on chronic diastolic (congestive) heart failure: Secondary | ICD-10-CM | POA: Diagnosis not present

## 2017-07-08 DIAGNOSIS — I872 Venous insufficiency (chronic) (peripheral): Secondary | ICD-10-CM | POA: Diagnosis not present

## 2017-07-09 DIAGNOSIS — I509 Heart failure, unspecified: Secondary | ICD-10-CM | POA: Diagnosis not present

## 2017-07-09 DIAGNOSIS — Z6821 Body mass index (BMI) 21.0-21.9, adult: Secondary | ICD-10-CM | POA: Diagnosis not present

## 2017-07-09 DIAGNOSIS — I10 Essential (primary) hypertension: Secondary | ICD-10-CM | POA: Diagnosis not present

## 2017-07-09 DIAGNOSIS — J9600 Acute respiratory failure, unspecified whether with hypoxia or hypercapnia: Secondary | ICD-10-CM | POA: Diagnosis not present

## 2017-07-09 DIAGNOSIS — E038 Other specified hypothyroidism: Secondary | ICD-10-CM | POA: Diagnosis not present

## 2017-07-09 DIAGNOSIS — E1021 Type 1 diabetes mellitus with diabetic nephropathy: Secondary | ICD-10-CM | POA: Diagnosis not present

## 2017-07-09 DIAGNOSIS — I480 Paroxysmal atrial fibrillation: Secondary | ICD-10-CM | POA: Diagnosis not present

## 2017-07-14 ENCOUNTER — Other Ambulatory Visit: Payer: Self-pay

## 2017-07-14 ENCOUNTER — Encounter (HOSPITAL_COMMUNITY): Payer: Self-pay | Admitting: Emergency Medicine

## 2017-07-14 ENCOUNTER — Emergency Department (HOSPITAL_COMMUNITY): Payer: Medicare HMO

## 2017-07-14 ENCOUNTER — Inpatient Hospital Stay (HOSPITAL_COMMUNITY)
Admission: EM | Admit: 2017-07-14 | Discharge: 2017-07-17 | DRG: 292 | Disposition: A | Discharge status: Home Health | Payer: Medicare HMO | Attending: Internal Medicine | Admitting: Internal Medicine

## 2017-07-14 DIAGNOSIS — R079 Chest pain, unspecified: Secondary | ICD-10-CM | POA: Diagnosis not present

## 2017-07-14 DIAGNOSIS — J9811 Atelectasis: Secondary | ICD-10-CM | POA: Diagnosis present

## 2017-07-14 DIAGNOSIS — I5033 Acute on chronic diastolic (congestive) heart failure: Secondary | ICD-10-CM | POA: Diagnosis not present

## 2017-07-14 DIAGNOSIS — I110 Hypertensive heart disease with heart failure: Principal | ICD-10-CM | POA: Diagnosis present

## 2017-07-14 DIAGNOSIS — I10 Essential (primary) hypertension: Secondary | ICD-10-CM | POA: Diagnosis present

## 2017-07-14 DIAGNOSIS — R4781 Slurred speech: Secondary | ICD-10-CM | POA: Diagnosis not present

## 2017-07-14 DIAGNOSIS — M7989 Other specified soft tissue disorders: Secondary | ICD-10-CM | POA: Diagnosis not present

## 2017-07-14 DIAGNOSIS — Z7901 Long term (current) use of anticoagulants: Secondary | ICD-10-CM

## 2017-07-14 DIAGNOSIS — R410 Disorientation, unspecified: Secondary | ICD-10-CM | POA: Diagnosis not present

## 2017-07-14 DIAGNOSIS — I4891 Unspecified atrial fibrillation: Secondary | ICD-10-CM | POA: Diagnosis present

## 2017-07-14 DIAGNOSIS — I482 Chronic atrial fibrillation: Secondary | ICD-10-CM | POA: Diagnosis not present

## 2017-07-14 DIAGNOSIS — Z794 Long term (current) use of insulin: Secondary | ICD-10-CM

## 2017-07-14 DIAGNOSIS — Z79899 Other long term (current) drug therapy: Secondary | ICD-10-CM

## 2017-07-14 DIAGNOSIS — R0602 Shortness of breath: Secondary | ICD-10-CM | POA: Diagnosis not present

## 2017-07-14 DIAGNOSIS — Z09 Encounter for follow-up examination after completed treatment for conditions other than malignant neoplasm: Secondary | ICD-10-CM

## 2017-07-14 DIAGNOSIS — E119 Type 2 diabetes mellitus without complications: Secondary | ICD-10-CM | POA: Diagnosis not present

## 2017-07-14 DIAGNOSIS — R05 Cough: Secondary | ICD-10-CM | POA: Diagnosis not present

## 2017-07-14 DIAGNOSIS — Z7982 Long term (current) use of aspirin: Secondary | ICD-10-CM

## 2017-07-14 DIAGNOSIS — Z8249 Family history of ischemic heart disease and other diseases of the circulatory system: Secondary | ICD-10-CM

## 2017-07-14 HISTORY — DX: Transient cerebral ischemic attack, unspecified: G45.9

## 2017-07-14 HISTORY — DX: Type 2 diabetes mellitus with diabetic polyneuropathy: E11.42

## 2017-07-14 HISTORY — DX: Type 2 diabetes mellitus without complications: E11.9

## 2017-07-14 HISTORY — DX: Paroxysmal atrial fibrillation: I48.0

## 2017-07-14 HISTORY — DX: Hypothyroidism, unspecified: E03.9

## 2017-07-14 HISTORY — DX: Other specified postprocedural states: R11.2

## 2017-07-14 HISTORY — DX: Chronic diastolic (congestive) heart failure: I50.32

## 2017-07-14 HISTORY — DX: Other specified postprocedural states: Z98.890

## 2017-07-14 HISTORY — DX: Anemia, unspecified: D64.9

## 2017-07-14 HISTORY — DX: Unspecified atrial fibrillation: I48.91

## 2017-07-14 HISTORY — DX: Essential (primary) hypertension: I10

## 2017-07-14 HISTORY — DX: Personal history of other medical treatment: Z92.89

## 2017-07-14 LAB — I-STAT TROPONIN, ED
TROPONIN I, POC: 0.01 ng/mL (ref 0.00–0.08)
Troponin i, poc: 0.01 ng/mL (ref 0.00–0.08)

## 2017-07-14 LAB — BRAIN NATRIURETIC PEPTIDE: B NATRIURETIC PEPTIDE 5: 631.7 pg/mL — AB (ref 0.0–100.0)

## 2017-07-14 LAB — CBC
HEMATOCRIT: 37.3 % (ref 36.0–46.0)
Hemoglobin: 12.4 g/dL (ref 12.0–15.0)
MCH: 27.6 pg (ref 26.0–34.0)
MCHC: 33.2 g/dL (ref 30.0–36.0)
MCV: 83.1 fL (ref 78.0–100.0)
Platelets: 212 10*3/uL (ref 150–400)
RBC: 4.49 MIL/uL (ref 3.87–5.11)
RDW: 16.5 % — ABNORMAL HIGH (ref 11.5–15.5)
WBC: 6.9 10*3/uL (ref 4.0–10.5)

## 2017-07-14 LAB — URINALYSIS, ROUTINE W REFLEX MICROSCOPIC
BACTERIA UA: NONE SEEN
Bilirubin Urine: NEGATIVE
Glucose, UA: NEGATIVE mg/dL
Hgb urine dipstick: NEGATIVE
KETONES UR: NEGATIVE mg/dL
Nitrite: NEGATIVE
PH: 6 (ref 5.0–8.0)
Protein, ur: NEGATIVE mg/dL
SQUAMOUS EPITHELIAL / LPF: NONE SEEN
Specific Gravity, Urine: 1.008 (ref 1.005–1.030)

## 2017-07-14 LAB — BASIC METABOLIC PANEL
ANION GAP: 13 (ref 5–15)
BUN: 39 mg/dL — ABNORMAL HIGH (ref 6–20)
CO2: 25 mmol/L (ref 22–32)
Calcium: 9.1 mg/dL (ref 8.9–10.3)
Chloride: 98 mmol/L — ABNORMAL LOW (ref 101–111)
Creatinine, Ser: 1.06 mg/dL — ABNORMAL HIGH (ref 0.44–1.00)
GFR, EST AFRICAN AMERICAN: 53 mL/min — AB (ref >60)
GFR, EST NON AFRICAN AMERICAN: 45 mL/min — AB (ref >60)
Glucose, Bld: 115 mg/dL — ABNORMAL HIGH (ref 65–99)
POTASSIUM: 4 mmol/L (ref 3.5–5.1)
SODIUM: 136 mmol/L (ref 135–145)

## 2017-07-14 LAB — PROTIME-INR
INR: 2.55
PROTHROMBIN TIME: 27.2 s — AB (ref 11.4–15.2)

## 2017-07-14 MED ORDER — SODIUM CHLORIDE 0.9 % IV BOLUS (SEPSIS): 500.0000 mL | Freq: Once | INTRAVENOUS | Status: DC

## 2017-07-14 MED ORDER — SODIUM CHLORIDE 0.9 % IV BOLUS (SEPSIS): 1000.0000 mL | Freq: Once | INTRAVENOUS | Status: DC

## 2017-07-14 MED ORDER — FUROSEMIDE 10 MG/ML IJ SOLN
40.0000 mg | Freq: Once | INTRAMUSCULAR | Status: AC
  Administered 2017-07-15: 40 mg via INTRAVENOUS
  Filled 2017-07-14: qty 4

## 2017-07-14 NOTE — ED Notes (Signed)
Lab work, radiology results and vital signs reviewed, no critical results at this time, no change in acuity indicated.  

## 2017-07-14 NOTE — ED Provider Notes (Signed)
[facility] Provider Note   CSN: [id number] Arrival date & time: 07/14/17  1557     History   Chief Complaint Chief Complaint  Patient presents with  . Shortness of Breath    HPI Judy Kent is a 84 y.o. female with history of insulin-dependent diabetes, atrial fibrillation on Coumadin, diastolic congestive heart failure, hypertension, TIA here for evaluation of shortness of breath with standing this afternoon. Describes shortness of breath as "I can't take a full breath in". Shortness of breath has been worse on exertion and alleviated with rest. She has lower extremity swelling since her last hospital admission a month ago, symmetric. She is on a diuretic and has been compliant. Daughter at bedside states that at discharge 1 month ago her weight was 114 and now it's in the 125 range. Daughter has noticed increased dry cough and abdominal swelling. No recent fevers, chills, chest pain, palpitations, dizziness, nausea, vomiting, abdominal pain, dysuria, hematuria, diarrhea. She follows up with Dr. Jenkins Rouge with cardiology. Started taking coreg BID one month ago.  HPI  Past Medical History:  Diagnosis Date  . Diabetes mellitus without complication [facility])     Patient Active Problem List   Diagnosis Date Noted  . Acute on chronic diastolic CHF (congestive heart failure) ([place]) 06/08/2017    Past Surgical History:  Procedure Laterality Date  . TONSILLECTOMY      OB History    No data available       Home Medications    Prior to Admission medications   Medication Sig Start Date End Date Taking? Authorizing Provider  amLODipine (NORVASC) 10 MG tablet Take 10 mg by mouth daily.   Yes [provider]  aspirin EC 81 MG tablet Take 81 mg by mouth daily.   Yes [provider]  Bilberry 500 MG CAPS Take 1 capsule by mouth daily. Vitamin A 750 mg Vitamin C 150 Vitamin E 30 Zinc 5 mg Sel 15 mg   Yes [provider]  carvedilol (COREG) 6.25 MG tablet Take 1 tablet (6.25 mg total) by mouth 2 (two) times daily with a meal. 06/10/17  Yes Patrecia Pour, Christean Grief, MD  Cholecalciferol (VITAMIN D3) 5000 units CAPS Take 5,000 Units by mouth daily.   Yes [provider]  FeAspGl-FeFum-B12-FA-C-Succ Ac (FERREX 28) MISC Take 28 mg by mouth every other day.   Yes [provider]  insulin degludec (TRESIBA FLEXTOUCH) 100 UNIT/ML SOPN FlexTouch Pen Inject 0.2 mLs (20 Units total) into the skin daily. Patient taking differently: Inject 18 Units into the skin daily.  06/10/17  Yes Patrecia Pour, Christean Grief, MD  insulin lispro (HUMALOG) 100 UNIT/ML cartridge Inject 1 Units into the skin See admin instructions. 1 unit per 15 carbs per sliding scale,adding  1 unit if BS is 150-200.   Yes [provider]  levothyroxine (SYNTHROID, LEVOTHROID) 50 MCG tablet Take 50 mcg by mouth daily before breakfast.   Yes [provider]  lisinopril (PRINIVIL,ZESTRIL) 10 MG tablet Take 10 mg by mouth at bedtime.    Yes [provider]  multivitamin-lutein (OCUVITE-LUTEIN) CAPS capsule Take 1 capsule by mouth daily.   Yes [provider]  mupirocin ointment (BACTROBAN) 2 % Apply 1 application topically 3 (three) times daily.   Yes [provider]  Omega 3-6-9 Fatty Acids (OMEGA 3-6-9 COMPLEX PO) Take 1 capsule by mouth 2 (two) times daily.   Yes [provider]  OVER THE COUNTER MEDICATION Take 1 tablet by mouth  daily. [place] Pen Naturally Healthy feet ans nerves   Yes [provider]  torsemide (DEMADEX) 20 MG tablet Take 20 mg by mouth daily.   Yes [provider]  warfarin (COUMADIN) 2.5 MG tablet Take 2.5 mg by mouth See admin instructions. Takes 2.5mg  on Monday, Wednesday and Saturday and friday Takes 5 mg on Sun tues. thurs   Yes [provider]  furosemide (LASIX) 20 MG tablet Take 1 tablet (20 mg total) by mouth daily. Take 2 tablets if increase in weight  above 5 Lb Patient not taking: Reported on 07/14/2017 06/10/17 07/10/17  Doreatha Lew, MD    Family History No family history on file.  Social History Social History   Tobacco Use  . Smoking status: Never Smoker  . Smokeless tobacco: Never Used  Substance Use Topics  . Alcohol use: No    Frequency: Never  . Drug use: No     Allergies   Cantaloupe (diagnostic)   Review of Systems Review of Systems  Respiratory: Positive for cough and shortness of breath.   Cardiovascular: Positive for leg swelling (no change).  All other systems reviewed and are negative.    Physical Exam Updated Vital Signs BP 134/77   Pulse (!) 38   Temp 98.1 F (36.7 C) (Oral)   Resp 17   Wt 57.3 kg (126 lb 4.8 oz)   SpO2 95%   BMI 23.86 kg/m   Physical Exam  Constitutional: She appears well-developed and well-nourished.  NAD. Non toxic.   HENT:  Head: Normocephalic and atraumatic.  Nose: Nose normal.  Moist mucous membranes. Tonsils and oropharynx normal  Eyes: Conjunctivae, EOM and lids are normal.  Neck: Trachea normal and normal range of motion.  Neck is supple Trachea midline No cervical adenopathy  Cardiovascular: S1 normal, S2 normal and normal heart sounds. An irregularly irregular rhythm present. Bradycardia present.  Pulses:      Carotid pulses are 2+ on the right side, and 2+ on the left side.      Radial pulses are 2+ on the right side, and 2+ on the left side.       Dorsalis pedis pulses are 2+ on the right side, and 2+ on the left side.  Bradycardic 50-60 while at rest. 1-2+ edema from knees to ankles, symmetric. Irregularly irregular. No orthopnea. No calf tenderness.   Pulmonary/Chest: Effort normal and breath sounds normal. No respiratory distress. She has no decreased breath sounds.  No rales or wheezing.  Abdominal: Soft. Bowel sounds are normal. She exhibits distension. There is no tenderness.  Questionable, mild abdominal distention. No epigastric  tenderness. No suprapubic or CVAT.   Neurological: She is alert. GCS eye subscore is 4. GCS verbal subscore is 5. GCS motor subscore is 6.  Skin: Skin is warm and dry. Capillary refill takes less than 2 seconds.  No rash to chest wall  Psychiatric: She has a normal mood and affect. Her speech is normal and behavior is normal. Judgment and thought content normal. Cognition and memory are normal.     ED Treatments / Results  Labs (all labs ordered are listed, but only abnormal results are displayed) Labs Reviewed  BASIC METABOLIC PANEL - Abnormal; Notable for the following components:      Result Value   Chloride 98 (*)    Glucose, Bld 115 (*)    BUN 39 (*)    Creatinine, Ser 1.06 (*)    GFR calc non Af Amer 45 (*)  GFR calc Af Amer 53 (*)    All other components within normal limits  CBC - Abnormal; Notable for the following components:   RDW 16.5 (*)    All other components within normal limits  BRAIN NATRIURETIC PEPTIDE - Abnormal; Notable for the following components:   B Natriuretic Peptide 631.7 (*)    All other components within normal limits  PROTIME-INR - Abnormal; Notable for the following components:   Prothrombin Time 27.2 (*)    All other components within normal limits  URINALYSIS, ROUTINE W REFLEX MICROSCOPIC - Abnormal; Notable for the following components:   Leukocytes, UA MODERATE (*)    All other components within normal limits  I-STAT TROPONIN, ED  I-STAT TROPONIN, ED    EKG  EKG Interpretation  Date/Time:  Thursday July 14 2017 16:01:39 EST Ventricular Rate:  57 PR Interval:    QRS Duration: 72 QT Interval:  420 QTC Calculation: 408 R Axis:   94 Text Interpretation:  Atrial fibrillation with slow ventricular response Rightward axis Septal infarct , age undetermined Abnormal ECG agree. no sig change from previous Confirmed by Charlesetta Shanks [phone]) on 07/14/2017 10:52:33 PM       Radiology Dg Chest 2 View  Result Date: 07/14/2017 CLINICAL  DATA:  Shortness of breath. EXAM: CHEST  2 VIEW COMPARISON:  06/08/2017 FINDINGS: The cardiac silhouette remains enlarged. Aortic atherosclerosis is noted. A small right pleural effusion is similar to the prior study, while the left-sided pleural effusion has decreased with only trace fluid remaining. There is compressive atelectasis in the right lung base. Left lower lobe aeration has improved. Suspected interstitial edema on the prior study has resolved. There is no evidence of new airspace consolidation or pneumothorax. No acute osseous abnormality is seen. IMPRESSION: 1. Persistent small right pleural effusion with associated atelectasis. 2. Near complete resolution of left pleural effusion with improved left basilar aeration. 3. Resolved edema. Electronically Signed   By: Logan Bores M.D.   On: 07/14/2017 17:39    Procedures Procedures (including critical care time)  Medications Ordered in ED Medications  furosemide (LASIX) injection 40 mg (not administered)     Initial Impression / Assessment and Plan / ED Course  I have reviewed the triage vital signs and the nursing notes.  Pertinent labs & imaging results that were available during my care of the patient were reviewed by me and considered in my medical decision making (see chart for details).  Clinical Course as of Jul 14 2352  Thu Jul 14, 2017  2138 Creatinine: (!) 1.06 [CG]  2138 IMPRESSION: 1. Persistent small right pleural effusion with associated atelectasis. 2. Near complete resolution of left pleural effusion with improved left basilar aeration. 3. Resolved edema DG Chest 2 View [CG]  2340 B Natriuretic Peptide: (!) 631.7 [CG]  2351 Leukocytes, UA: (!) MODERATE [CG]    Clinical Course User Index [CG] Kinnie Feil, PA-C   84 year old here for shortness of breath. She looks slightly fluid overloaded on exam, lungs are clear. Bradycardic in the 40s to 60s range. 10 pound increase in weight since last admission.    ED workup remarkable for elevated BNP, leukocytes and urine, persistent small right pleural effusion with significant improvement in left pleural effusion.  Creatinine only slightly elevated. Will admit for CHF exacerbation. Lasix 40 ordered.   Final Clinical Impressions(s) / ED Diagnoses   Final diagnoses:  Acute on chronic diastolic congestive heart failure [facility])    ED Discharge Orders    None  Kinnie Feil, PA-C 07/14/17 2354    Charlesetta Shanks, MD 07/15/17 [phone]

## 2017-07-14 NOTE — ED Triage Notes (Signed)
Pt c/o shortness of breath starting this morning. States it feels like she is unable to take a deep breath. Denies chest pain or hx COPD/CHF. LS clear.

## 2017-07-15 ENCOUNTER — Encounter (HOSPITAL_COMMUNITY): Payer: Self-pay | Admitting: Internal Medicine

## 2017-07-15 ENCOUNTER — Other Ambulatory Visit: Payer: Self-pay

## 2017-07-15 DIAGNOSIS — I4891 Unspecified atrial fibrillation: Secondary | ICD-10-CM | POA: Diagnosis present

## 2017-07-15 DIAGNOSIS — I10 Essential (primary) hypertension: Secondary | ICD-10-CM

## 2017-07-15 DIAGNOSIS — Z794 Long term (current) use of insulin: Secondary | ICD-10-CM

## 2017-07-15 DIAGNOSIS — I110 Hypertensive heart disease with heart failure: Secondary | ICD-10-CM | POA: Diagnosis present

## 2017-07-15 DIAGNOSIS — I482 Chronic atrial fibrillation: Secondary | ICD-10-CM | POA: Diagnosis present

## 2017-07-15 DIAGNOSIS — Z8249 Family history of ischemic heart disease and other diseases of the circulatory system: Secondary | ICD-10-CM | POA: Diagnosis not present

## 2017-07-15 DIAGNOSIS — E119 Type 2 diabetes mellitus without complications: Secondary | ICD-10-CM

## 2017-07-15 DIAGNOSIS — R001 Bradycardia, unspecified: Secondary | ICD-10-CM | POA: Diagnosis not present

## 2017-07-15 DIAGNOSIS — Z7901 Long term (current) use of anticoagulants: Secondary | ICD-10-CM | POA: Diagnosis not present

## 2017-07-15 DIAGNOSIS — Z79899 Other long term (current) drug therapy: Secondary | ICD-10-CM | POA: Diagnosis not present

## 2017-07-15 DIAGNOSIS — I5033 Acute on chronic diastolic (congestive) heart failure: Secondary | ICD-10-CM | POA: Diagnosis present

## 2017-07-15 DIAGNOSIS — Z7982 Long term (current) use of aspirin: Secondary | ICD-10-CM | POA: Diagnosis not present

## 2017-07-15 DIAGNOSIS — J9811 Atelectasis: Secondary | ICD-10-CM | POA: Diagnosis present

## 2017-07-15 LAB — PROTIME-INR
INR: 2.46
PROTHROMBIN TIME: 26.4 s — AB (ref 11.4–15.2)

## 2017-07-15 LAB — GLUCOSE, CAPILLARY
Glucose-Capillary: 402 mg/dL — ABNORMAL HIGH (ref 65–99)
Glucose-Capillary: 287 mg/dL — ABNORMAL HIGH (ref 65–99)

## 2017-07-15 LAB — BASIC METABOLIC PANEL
ANION GAP: 12 (ref 5–15)
BUN: 29 mg/dL — ABNORMAL HIGH (ref 6–20)
CHLORIDE: 98 mmol/L — AB (ref 101–111)
CO2: 26 mmol/L (ref 22–32)
CREATININE: 0.96 mg/dL (ref 0.44–1.00)
Calcium: 8.9 mg/dL (ref 8.9–10.3)
GFR calc non Af Amer: 51 mL/min — ABNORMAL LOW (ref >60)
GFR, EST AFRICAN AMERICAN: 59 mL/min — AB (ref >60)
Glucose, Bld: 164 mg/dL — ABNORMAL HIGH (ref 65–99)
Potassium: 3.3 mmol/L — ABNORMAL LOW (ref 3.5–5.1)
Sodium: 136 mmol/L (ref 135–145)

## 2017-07-15 LAB — CBG MONITORING, ED: Glucose-Capillary: 150 mg/dL — ABNORMAL HIGH (ref 65–99)

## 2017-07-15 LAB — MAGNESIUM: Magnesium: 2 mg/dL (ref 1.7–2.4)

## 2017-07-15 MED ORDER — FUROSEMIDE 10 MG/ML IJ SOLN
40.0000 mg | Freq: Two times a day (BID) | INTRAMUSCULAR | Status: DC
  Administered 2017-07-15 – 2017-07-17 (×5): 40 mg via INTRAVENOUS
  Filled 2017-07-15 (×6): qty 4

## 2017-07-15 MED ORDER — SPIRONOLACTONE 25 MG PO TABS
25.0000 mg | ORAL_TABLET | Freq: Every day | ORAL | Status: DC
  Administered 2017-07-16 – 2017-07-17 (×2): 25 mg via ORAL
  Filled 2017-07-15 (×2): qty 1

## 2017-07-15 MED ORDER — ONDANSETRON HCL 4 MG/2ML IJ SOLN: 4.0000 mg | Freq: Four times a day (QID) | INTRAMUSCULAR | Status: DC | PRN

## 2017-07-15 MED ORDER — PROSIGHT PO TABS
1.0000 | ORAL_TABLET | Freq: Every day | ORAL | Status: DC
  Administered 2017-07-16 – 2017-07-17 (×2): 1 via ORAL
  Filled 2017-07-15 (×3): qty 1

## 2017-07-15 MED ORDER — INSULIN GLARGINE 100 UNIT/ML ~~LOC~~ SOLN
18.0000 [IU] | Freq: Every day | SUBCUTANEOUS | Status: DC
  Administered 2017-07-15 – 2017-07-17 (×3): 18 [IU] via SUBCUTANEOUS
  Filled 2017-07-15 (×4): qty 0.18

## 2017-07-15 MED ORDER — POTASSIUM CHLORIDE CRYS ER 20 MEQ PO TBCR
40.0000 meq | EXTENDED_RELEASE_TABLET | Freq: Once | ORAL | Status: AC
  Administered 2017-07-15: 40 meq via ORAL
  Filled 2017-07-15: qty 2

## 2017-07-15 MED ORDER — LISINOPRIL 10 MG PO TABS
10.0000 mg | ORAL_TABLET | Freq: Every day | ORAL | Status: DC
  Administered 2017-07-15 – 2017-07-16 (×3): 10 mg via ORAL
  Filled 2017-07-15 (×3): qty 1

## 2017-07-15 MED ORDER — AMLODIPINE BESYLATE 10 MG PO TABS
10.0000 mg | ORAL_TABLET | Freq: Every day | ORAL | Status: DC
  Administered 2017-07-15 – 2017-07-17 (×3): 10 mg via ORAL
  Filled 2017-07-15: qty 1
  Filled 2017-07-15: qty 2
  Filled 2017-07-15: qty 1

## 2017-07-15 MED ORDER — WARFARIN SODIUM 2.5 MG PO TABS
2.5000 mg | ORAL_TABLET | Freq: Once | ORAL | Status: AC
  Administered 2017-07-15: 2.5 mg via ORAL
  Filled 2017-07-15 (×2): qty 1

## 2017-07-15 MED ORDER — INSULIN ASPART 100 UNIT/ML ~~LOC~~ SOLN
0.0000 [IU] | Freq: Three times a day (TID) | SUBCUTANEOUS | Status: DC
  Administered 2017-07-16: 5 [IU] via SUBCUTANEOUS
  Administered 2017-07-16: 3 [IU] via SUBCUTANEOUS
  Administered 2017-07-17: 5 [IU] via SUBCUTANEOUS

## 2017-07-15 MED ORDER — LEVOTHYROXINE SODIUM 50 MCG PO TABS
50.0000 ug | ORAL_TABLET | Freq: Every day | ORAL | Status: DC
  Administered 2017-07-15 – 2017-07-17 (×3): 50 ug via ORAL
  Filled 2017-07-15 (×4): qty 1

## 2017-07-15 MED ORDER — VITAMIN D 1000 UNITS PO TABS
5000.0000 [IU] | ORAL_TABLET | Freq: Every day | ORAL | Status: DC
  Administered 2017-07-15 – 2017-07-17 (×3): 5000 [IU] via ORAL
  Filled 2017-07-15 (×3): qty 5

## 2017-07-15 MED ORDER — ASPIRIN EC 81 MG PO TBEC
81.0000 mg | DELAYED_RELEASE_TABLET | Freq: Every day | ORAL | Status: DC
  Administered 2017-07-15 – 2017-07-17 (×3): 81 mg via ORAL
  Filled 2017-07-15 (×3): qty 1

## 2017-07-15 MED ORDER — WARFARIN - PHARMACIST DOSING INPATIENT: Freq: Every day | Status: DC

## 2017-07-15 MED ORDER — INSULIN ASPART 100 UNIT/ML ~~LOC~~ SOLN
1.0000 [IU] | Freq: Three times a day (TID) | SUBCUTANEOUS | Status: DC
  Administered 2017-07-15: 4.13 [IU] via SUBCUTANEOUS
  Administered 2017-07-15: 2 [IU] via SUBCUTANEOUS
  Filled 2017-07-15 (×2): qty 1

## 2017-07-15 MED ORDER — CARVEDILOL 3.125 MG PO TABS
3.1250 mg | ORAL_TABLET | Freq: Two times a day (BID) | ORAL | Status: DC
  Filled 2017-07-15: qty 1

## 2017-07-15 MED ORDER — SODIUM CHLORIDE 0.9% FLUSH
3.0000 mL | Freq: Two times a day (BID) | INTRAVENOUS | Status: DC
  Administered 2017-07-15 – 2017-07-17 (×4): 3 mL via INTRAVENOUS

## 2017-07-15 MED ORDER — WARFARIN SODIUM 5 MG PO TABS
5.0000 mg | ORAL_TABLET | ORAL | Status: AC
  Administered 2017-07-15: 5 mg via ORAL
  Filled 2017-07-15: qty 1

## 2017-07-15 MED ORDER — SODIUM CHLORIDE 0.9% FLUSH: 3.0000 mL | INTRAVENOUS | Status: DC | PRN

## 2017-07-15 MED ORDER — ACETAMINOPHEN 325 MG PO TABS: 650.0000 mg | ORAL_TABLET | ORAL | Status: DC | PRN

## 2017-07-15 MED ORDER — SODIUM CHLORIDE 0.9 % IV SOLN: 250.0000 mL | INTRAVENOUS | Status: DC | PRN

## 2017-07-15 MED ORDER — CARVEDILOL 12.5 MG PO TABS
6.2500 mg | ORAL_TABLET | Freq: Two times a day (BID) | ORAL | Status: DC
  Administered 2017-07-15: 6.25 mg via ORAL
  Filled 2017-07-15: qty 1

## 2017-07-15 MED ORDER — DM-GUAIFENESIN ER 30-600 MG PO TB12
1.0000 | ORAL_TABLET | Freq: Two times a day (BID) | ORAL | Status: DC
  Administered 2017-07-15 – 2017-07-17 (×5): 1 via ORAL
  Filled 2017-07-15 (×5): qty 1

## 2017-07-15 NOTE — Progress Notes (Signed)
ANTICOAGULATION CONSULT NOTE - Initial Consult  Pharmacy Consult for warfarin Indication: atrial fibrillation  Allergies  Allergen Reactions  . Cantaloupe (Diagnostic) Swelling    Patient Measurements: Weight: 126 lb 4.8 oz (57.3 kg)   Vital Signs: Temp: 98.1 F (36.7 C) (01/24 1832) Temp Source: Oral (01/24 1832) BP: 141/62 (01/24 2330) Pulse Rate: 62 (01/24 2330)  Labs: Recent Labs    07/14/17 1607  HGB 12.4  HCT 37.3  PLT 212  LABPROT 27.2*  INR 2.55  CREATININE 1.06*    Estimated Creatinine Clearance: 27.7 mL/min (A) (by C-G formula based on SCr of 1.06 mg/dL (H)).   Medical History: Past Medical History:  Diagnosis Date  . A-fib ([place])   . Chronic diastolic CHF (congestive heart failure) ([place])   . Diabetes mellitus without complication ([place])   . HTN (hypertension)     Assessment: 84 yo female on warfarin pta for AFib. Last dose was on 1/23 Current INR therapeutic 2.5 PTA dose: 5 mg q Sun/Tues/Thurs, 2.5 mg on all other days  Goal of Therapy:  INR 2-3 Monitor platelets by anticoagulation protocol: Yes    Plan:  -warfarin 5 mg po x1 now to serve as dose for 1/24  -will also order dose 2.5 mg for evening of 1/25  -daily INR   Darcee Dekker, Jake Church 07/15/2017,12:51 AM

## 2017-07-15 NOTE — ED Notes (Signed)
Pt daughter asked to be called with any updates. Sharene Butters [phone]

## 2017-07-15 NOTE — ED Notes (Signed)
Carb Mortified Diet has been ordered for Lunch. 

## 2017-07-15 NOTE — Evaluation (Signed)
Physical Therapy Evaluation Patient Details Name: Judy Kent MRN: [id number] DOB: 1927/04/10 Today's Date: 07/15/2017   History of Present Illness  Pt is an 84 y/o female admitted secondary to SOB and found to have acute on chronic heart failure. PMH including but not limited to a-fib, CHF, DM and HTN.     Clinical Impression  Pt presented supine in bed with HOB elevated, awake and willing to participate in therapy session. Prior to admission, pt reported that she ambulates with use of three-wheeled walker and is independent with ADLs. Pt is from [place] and is staying in [place] with her daughter for the winter. Pt ambulated within her room with RW and min guard for safety. Pt would continue to benefit from skilled physical therapy services at this time while admitted and after d/c to address the below listed limitations in order to improve overall safety and independence with functional mobility.     Follow Up Recommendations Home health PT;Supervision/Assistance - 24 hour    Equipment Recommendations  None recommended by PT    Recommendations for Other Services       Precautions / Restrictions Precautions Precautions: Fall Restrictions Weight Bearing Restrictions: No      Mobility  Bed Mobility Overal bed mobility: Needs Assistance Bed Mobility: Supine to Sit;Sit to Supine     Supine to sit: Min guard Sit to supine: Min guard   General bed mobility comments: increased time and effort, min guard for safety  Transfers Overall transfer level: Needs assistance Equipment used: Rolling walker (2 wheeled) Transfers: Sit to/from [facility] Sit to Stand: Min guard Stand pivot transfers: Min guard       General transfer comment: increased time, min guard for safety  Ambulation/Gait Ambulation/Gait assistance: Min guard Ambulation Distance (Feet): 30 Feet Assistive device: Rolling walker (2 wheeled) Gait Pattern/deviations: Step-through pattern;Decreased  step length - right;Decreased step length - left;Decreased stride length;Trunk flexed Gait velocity: decreased   General Gait Details: mild instability but no overt LOB or need for physical assistance, min guard for safety  Stairs            Wheelchair Mobility    Modified Rankin (Stroke Patients Only)       Balance Overall balance assessment: Needs assistance Sitting-balance support: Feet supported Sitting balance-Leahy Scale: Good     Standing balance support: During functional activity;Bilateral upper extremity supported Standing balance-Leahy Scale: Poor Standing balance comment: reliant on bilateral UEs on RW                             Pertinent Vitals/Pain Pain Assessment: No/denies pain    Home Living Family/patient expects to be discharged to:: Private residence Living Arrangements: Children Available Help at Discharge: Family;Available 24 hours/day Type of Home: House Home Access: Stairs to enter Entrance Stairs-Rails: Psychiatric nurse of Steps: 2 Home Layout: Two level Home Equipment: Shower seat;Other (comment)(3-wheeled walker) Additional Comments: pt is staying at her daughter's house for the winter. pt is from [place].    Prior Function Level of Independence: Independent with assistive device(s)         Comments: ambulates with 3-wheeled walker     Hand Dominance        Extremity/Trunk Assessment   Upper Extremity Assessment Upper Extremity Assessment: Overall WFL for tasks assessed    Lower Extremity Assessment Lower Extremity Assessment: Overall WFL for tasks assessed    Cervical / Trunk Assessment Cervical / Trunk Assessment: Kyphotic  Communication   Communication: No difficulties  Cognition Arousal/Alertness: Awake/alert Behavior During Therapy: WFL for tasks assessed/performed Overall Cognitive Status: Impaired/Different from baseline Area of Impairment: Attention;Memory;Problem solving                    Current Attention Level: Sustained Memory: Decreased short-term memory       Problem Solving: Difficulty sequencing;Requires verbal cues        General Comments      Exercises     Assessment/Plan    PT Assessment Patient needs continued PT services  PT Problem List Decreased balance;Decreased mobility;Decreased coordination;Decreased cognition;Cardiopulmonary status limiting activity       PT Treatment Interventions DME instruction;Gait training;Stair training;Functional mobility training;Therapeutic activities;Therapeutic exercise;Balance training;Neuromuscular re-education;Cognitive remediation;Patient/family education    PT Goals (Current goals can be found in the Care Plan section)  Acute Rehab PT Goals Patient Stated Goal: return to daughter's house PT Goal Formulation: With patient/family Time For Goal Achievement: 07/29/17 Potential to Achieve Goals: Good    Frequency Min 3X/week   Barriers to discharge        Co-evaluation               AM-PAC PT "6 Clicks" Daily Activity  Outcome Measure Difficulty turning over in bed (including adjusting bedclothes, sheets and blankets)?: A Little Difficulty moving from lying on back to sitting on the side of the bed? : A Little Difficulty sitting down on and standing up from a chair with arms (e.g., wheelchair, bedside commode, etc,.)?: Unable Help needed moving to and from a bed to chair (including a wheelchair)?: None Help needed walking in hospital room?: A Little Help needed climbing 3-5 steps with a railing? : A Little 6 Click Score: 17    End of Session Equipment Utilized During Treatment: Gait belt Activity Tolerance: Patient tolerated treatment well Patient left: in bed;with call bell/phone within reach;with family/visitor present Nurse Communication: Mobility status PT Visit Diagnosis: Other abnormalities of gait and mobility (R26.89)    Time: 9407-6808 PT Time Calculation (min)  (ACUTE ONLY): 34 min   Charges:   PT Evaluation $PT Eval Low Complexity: 1 Low PT Treatments $Therapeutic Activity: 8-22 mins   PT G Codes:        Latta, PT, DPT [place] 07/15/2017, 4:14 PM

## 2017-07-15 NOTE — ED Notes (Signed)
Patient CBG was 150, Nurse Vicente Males was informed.

## 2017-07-15 NOTE — Care Management Note (Signed)
Case Management Note  Patient Details  Name: Judy Kent MRN: [id number] Date of Birth: 05-Feb-1927  Subjective/Objective:   CHF                Action/Plan: 07/15/2017 - CM following for progression of care; awaiting for Physical Therapy eval for disposition needs; Aneta Mins  06/10/2018 - Action/Plan: Pt for discharge today.  In to speak with PT, daughter at bedside. Prior to admission Pt lived home with daughter.  PCP Noted.  Home DME equipment includes: 3 in 1, Shower chair. Uses [facility] on [address], [place], [place].  Pt denies inability to afford medications or Food/Nutrition.  Pt uses daughter for transportation  to go shopping and medical appointments. No discharge needs noted.Mauricio Po RN CM   Expected Discharge Date:      Possibly 07/19/2017            Expected Discharge Plan:  [place] possibly  Status of Service:  In process, will continue to follow  Royston Bake, RN 07/15/2017, 2:21 PM

## 2017-07-15 NOTE — Care Management Obs Status (Signed)
[facility] NOTIFICATION   Patient Details  Name: Judy Kent MRN: [id number] Date of Birth: 15-Feb-1927   Medicare Observation Status Notification Given:  Yes    Carles Collet, RN 07/15/2017, 3:15 PM

## 2017-07-15 NOTE — Progress Notes (Signed)
Notified MD Carolin Sicks of patient's occasionally heart rate drop to 35, patient is asymptomatic, awaiting callback Neta Mends RN 2:41 PM 07-15-2017

## 2017-07-15 NOTE — Progress Notes (Signed)
Patient was seen and examined at bedside in ER.  She was admitted after midnight.  Please see H&P for detail.  84 year old female with history of paroxysmal atrial fibrillation on chronic anticoagulation/Coumadin, type 2 diabetes, essential hypertension, chronic diastolic congestive heart failure presented with shortness of breath, dyspnea on exertion, lower extremity edema and weight gain consistent with acute on chronic diastolic congestive heart failure.  Continue IV Lasix.  Shortness of breath is improving.  Patient was found to have bradycardia therefore reduce the dose of Coreg to half.  Continue telemetry monitoring.  Strict ins and outs, daily weight.  Cardiology consult to Dr. Einar Gip, discussed with him as well. Repleted potassium chloride for hypokalemia.  Repeat labs in the morning. INR therapeutic. Continue current medication.

## 2017-07-15 NOTE — ED Notes (Signed)
62g carbs on breakfast tray - will give 4.13 per insulin orders

## 2017-07-15 NOTE — Consult Note (Signed)
Reason for Consult:CHF Referring Physician: Benjamin Stain, MD  Judy Kent is an 84 y.o. female.  HPI: Patient was recently admitted to the hospital on December 2876 for diastolic heart failure exacerbation. Echocardiogram at that time showed LVEF of 81-15% and diastolic dysfunction.Patient was diuresed and discharged home. Since being discharged, patient reports her weight has again increased. Had seen her about 10 days ago and switched her from furosemide to torsemide due to acute ongoing diastolic heart failure and she had already gained significant amount weight. She lost additional 2-3 pounds over weight plateaued off. She had been doing well until yesterday she started noticing shortness of breath for the first time and she continued to have leg edema hence was brought to the emergency room. She has received IV diuresis with good response and started to feel better. Her daughter is present. Denies any chest pain, palpitations.  Her last office visit I had also increased her Coreg from 6.25 mg once a day to twice a day insulin she had noticed fatigue. She was found to be bradycardic and hence her Coreg dose has been reduced.  Past Medical History:  Diagnosis Date  . A-fib ([place])   . Chronic diastolic CHF (congestive heart failure) ([place])   . Diabetes mellitus without complication ([place])   . HTN (hypertension)      Past Surgical History:  Procedure Laterality Date  . TONSILLECTOMY      Family History  Problem Relation Age of Onset  . Heart attack Other 5    Social History:  reports that  has never smoked. she has never used smokeless tobacco. She reports that she does not drink alcohol or use drugs.  Allergies:  Allergies  Allergen Reactions  . Cantaloupe (Diagnostic) Swelling    Results for orders placed or performed during the hospital encounter of 07/14/17 (from the past 48 hour(s))  Basic metabolic panel     Status: Abnormal   Collection Time: 07/14/17  4:07 PM   Result Value Ref Range   Sodium 136 135 - 145 mmol/L   Potassium 4.0 3.5 - 5.1 mmol/L   Chloride 98 (L) 101 - 111 mmol/L   CO2 25 22 - 32 mmol/L   Glucose, Bld 115 (H) 65 - 99 mg/dL   BUN 39 (H) 6 - 20 mg/dL   Creatinine, Ser 1.06 (H) 0.44 - 1.00 mg/dL   Calcium 9.1 8.9 - 10.3 mg/dL   GFR calc non Af Amer 45 (L) >60 mL/min   GFR calc Af Amer 53 (L) >60 mL/min    Comment: (NOTE) The eGFR has been calculated using the CKD EPI equation. This calculation has not been validated in all clinical situations. eGFR's persistently <60 mL/min signify possible Chronic Kidney Disease.    Anion gap 13 5 - 15  CBC     Status: Abnormal   Collection Time: 07/14/17  4:07 PM  Result Value Ref Range   WBC 6.9 4.0 - 10.5 K/uL   RBC 4.49 3.87 - 5.11 MIL/uL   Hemoglobin 12.4 12.0 - 15.0 g/dL   HCT 37.3 36.0 - 46.0 %   MCV 83.1 78.0 - 100.0 fL   MCH 27.6 26.0 - 34.0 pg   MCHC 33.2 30.0 - 36.0 g/dL   RDW 16.5 (H) 11.5 - 15.5 %   Platelets 212 150 - 400 K/uL  Protime-INR     Status: Abnormal   Collection Time: 07/14/17  4:07 PM  Result Value Ref Range   Prothrombin Time 27.2 (H) 11.4 -  15.2 seconds   INR 2.55   I-stat troponin, ED     Status: None   Collection Time: 07/14/17  4:31 PM  Result Value Ref Range   Troponin i, poc 0.01 0.00 - 0.08 ng/mL   Comment 3            Comment: Due to the release kinetics of cTnI, a negative result within the first hours of the onset of symptoms does not rule out myocardial infarction with certainty. If myocardial infarction is still suspected, repeat the test at appropriate intervals.   Brain natriuretic peptide     Status: Abnormal   Collection Time: 07/14/17 10:04 PM  Result Value Ref Range   B Natriuretic Peptide 631.7 (H) 0.0 - 100.0 pg/mL  I-Stat Troponin, ED (not at [facility])     Status: None   Collection Time: 07/14/17 10:11 PM  Result Value Ref Range   Troponin i, poc 0.01 0.00 - 0.08 ng/mL   Comment 3            Comment: Due to the release  kinetics of cTnI, a negative result within the first hours of the onset of symptoms does not rule out myocardial infarction with certainty. If myocardial infarction is still suspected, repeat the test at appropriate intervals.   Urinalysis, Routine w reflex microscopic     Status: Abnormal   Collection Time: 07/14/17 11:13 PM  Result Value Ref Range   Color, Urine YELLOW YELLOW   APPearance CLEAR CLEAR   Specific Gravity, Urine 1.008 1.005 - 1.030   pH 6.0 5.0 - 8.0   Glucose, UA NEGATIVE NEGATIVE mg/dL   Hgb urine dipstick NEGATIVE NEGATIVE   Bilirubin Urine NEGATIVE NEGATIVE   Ketones, ur NEGATIVE NEGATIVE mg/dL   Protein, ur NEGATIVE NEGATIVE mg/dL   Nitrite NEGATIVE NEGATIVE   Leukocytes, UA MODERATE (A) NEGATIVE   RBC / HPF 0-5 0 - 5 RBC/hpf   WBC, UA 6-30 0 - 5 WBC/hpf   Bacteria, UA NONE SEEN NONE SEEN   Squamous Epithelial / LPF NONE SEEN NONE SEEN  Protime-INR     Status: Abnormal   Collection Time: 07/15/17  3:25 AM  Result Value Ref Range   Prothrombin Time 26.4 (H) 11.4 - 15.2 seconds   INR 2.46   CBG monitoring, ED     Status: Abnormal   Collection Time: 07/15/17  9:05 AM  Result Value Ref Range   Glucose-Capillary 150 (H) 65 - 99 mg/dL   Comment 1 Notify RN    Comment 2 Document in Chart     Dg Chest 2 View  Result Date: 07/14/2017 CLINICAL DATA:  Shortness of breath. EXAM: CHEST  2 VIEW COMPARISON:  06/08/2017 FINDINGS: The cardiac silhouette remains enlarged. Aortic atherosclerosis is noted. A small right pleural effusion is similar to the prior study, while the left-sided pleural effusion has decreased with only trace fluid remaining. There is compressive atelectasis in the right lung base. Left lower lobe aeration has improved. Suspected interstitial edema on the prior study has resolved. There is no evidence of new airspace consolidation or pneumothorax. No acute osseous abnormality is seen. IMPRESSION: 1. Persistent small right pleural effusion with  associated atelectasis. 2. Near complete resolution of left pleural effusion with improved left basilar aeration. 3. Resolved edema. Electronically Signed   By: Logan Bores M.D.   On: 07/14/2017 17:39    Review of Systems  Constitutional: Positive for malaise/fatigue. Negative for diaphoresis and fever.  HENT: Negative.   Eyes: Negative.  Respiratory: Positive for cough and shortness of breath. Negative for sputum production.   Cardiovascular: Positive for orthopnea and leg swelling. Negative for chest pain, palpitations, claudication and PND.  Gastrointestinal: Negative.   Genitourinary: Negative.   Musculoskeletal: Negative.   Skin: Negative.   Neurological: Positive for weakness.  Endo/Heme/Allergies: Negative.   Psychiatric/Behavioral: Negative.      Scheduled Meds: . amLODipine  10 mg Oral Daily  . aspirin EC  81 mg Oral Daily  . carvedilol  3.125 mg Oral BID WC  . cholecalciferol  5,000 Units Oral Daily  . dextromethorphan-guaiFENesin  1 tablet Oral BID  . furosemide  40 mg Intravenous BID  . insulin aspart  1-10 Units Subcutaneous TID WC  . insulin glargine  18 Units Subcutaneous Daily  . levothyroxine  50 mcg Oral QAC breakfast  . lisinopril  10 mg Oral QHS  . multivitamin  1 tablet Oral Daily  . potassium chloride  40 mEq Oral Once  . sodium chloride flush  3 mL Intravenous Q12H  . warfarin  2.5 mg Oral ONCE-1800  . Warfarin - Pharmacist Dosing Inpatient   Does not apply q1800   Continuous Infusions: . sodium chloride     PRN Meds:.sodium chloride, acetaminophen, ondansetron (ZOFRAN) IV, sodium chloride flush   Blood pressure 103/82, pulse (!) 50, temperature 98.1 F (36.7 C), temperature source Oral, resp. rate 17, weight 57.3 kg (126 lb 4.8 oz), SpO2 92 %.   Physical Exam  Constitutional: She is oriented to person, place, and time.  Petite, in no acute distress.  HENT:  Head: Atraumatic.  Eyes: Conjunctivae are normal.  Neck: JVD (JVD elevated to the  angle of jaw) present.  Cardiovascular:  Irregular rhythm, there is a 2/6 midsystolic murmur heard, no gallop.  Respiratory:  Bilateral basilar crackles heard.  GI:  Mild hepatomegaly just below the right costal margin present. Otherwise abdomen is nontender, bowel sounds heard in all 4 quadrants.  Musculoskeletal: Normal range of motion. She exhibits edema (2+ pitting bilateral below-knee).  Neurological: She is alert and oriented to person, place, and time.  Skin: Skin is warm and dry.  Psychiatric: She has a normal mood and affect.   EKG 07/14/2017: Atrial fibrillation with controlled ventricular response at a rate of 57 beats a minute, normal axis, poor RV progression, cannot exclude anteroseptal infarct old. Nonspecific T abnormality. Normal QT interval.  Echocardiogram 06/09/2017: Normal LV size, mild LVH, EF 60-65%. Moderate tricuspid regurgitation, moderate pulmonary hypertension, depression 47 mmHg.  Assessment/Plan: 1. Acute on chronic diastolic heart failure 2. Chronic atrial fibrillation CHA2DS2-VASCScore: Risk Score  5. Recommendation: ASA No/Anticoagulation Yes 3. Hypertension 4. Diabetes mellitus type 2 controlled without hyperglycemia with stage III chronic kidney disease  Recommendation: Patient is diuresing well with IV furosemide. Still has significant JVD, leg edema. Will continue diuresis for the next 24-48 hours, get her to baseline weight and can potentially be discharged home by either Sunday or Monday morning. I can follow her closely post discharge.  She continues to be bradycardic, heart rate was in 30-40 bpm when she came in, we'll watch her on telemetry with low dose of Coreg which has been reduced and if she continues to have significant bradycardia with heart rate less than or equal to 40-50 bpm, we may consider discontinuing beta blocker.  Renal function remains stable. Blood pressure is well controlled. I will also add spironolactone and will continue to  trend her renal function   Adrian Prows 07/15/2017, 9:48 AM

## 2017-07-15 NOTE — H&P (Signed)
History and Physical    Judy Kent HQI:[id number] DOB: 12/31/1926 DOA: 07/14/2017  PCP: Reynold Bowen, MD  Patient coming from: Home  I have personally briefly reviewed patient's old medical records in [place]  Chief Complaint: SOB  HPI: Judy Kent is a 84 y.o. female with medical history significant of A.Fib DM, HTN, diastolic CHF.  Patient was admitted with diastolic CHF 1 month ago.  Diuresed off 6L of fluid, dry wt 115 lbs at discharge.  Since discharge despite taking lasix as instructed wt, peripheral edema, DOE, has slowly and progressively returned.  Presents to ED.   ED Course: 126lbs.  BNP 600.  Trace R pleural effusion on X ray.   Review of Systems: As per HPI otherwise 10 point review of systems negative.   Past Medical History:  Diagnosis Date  . A-fib ([place])   . Chronic diastolic CHF (congestive heart failure) ([place])   . Diabetes mellitus without complication ([place])   . HTN (hypertension)     Past Surgical History:  Procedure Laterality Date  . TONSILLECTOMY       reports that  has never smoked. she has never used smokeless tobacco. She reports that she does not drink alcohol or use drugs.  Allergies  Allergen Reactions  . Cantaloupe (Diagnostic) Swelling    Family History  Problem Relation Age of Onset  . Heart attack Other 33       Prior to Admission medications   Medication Sig Start Date End Date Taking? Authorizing Provider  amLODipine (NORVASC) 10 MG tablet Take 10 mg by mouth daily.   Yes [provider]  aspirin EC 81 MG tablet Take 81 mg by mouth daily.   Yes [provider]  Bilberry 500 MG CAPS Take 1 capsule by mouth daily. Vitamin A 750 mg Vitamin C 150 Vitamin E 30 Zinc 5 mg Sel 15 mg   Yes [provider]  carvedilol (COREG) 6.25 MG tablet Take 1 tablet (6.25 mg total) by mouth 2 (two) times daily with a meal. 06/10/17  Yes Patrecia Pour, Christean Grief, MD  Cholecalciferol (VITAMIN D3) 5000 units  CAPS Take 5,000 Units by mouth daily.   Yes [provider]  FeAspGl-FeFum-B12-FA-C-Succ Ac (FERREX 28) MISC Take 28 mg by mouth every other day.   Yes [provider]  insulin degludec (TRESIBA FLEXTOUCH) 100 UNIT/ML SOPN FlexTouch Pen Inject 0.2 mLs (20 Units total) into the skin daily. Patient taking differently: Inject 18 Units into the skin daily.  06/10/17  Yes Patrecia Pour, Christean Grief, MD  insulin lispro (HUMALOG) 100 UNIT/ML cartridge Inject 1 Units into the skin See admin instructions. 1 unit per 15 carbs per sliding scale,adding  1 unit if BS is 150-200.   Yes [provider]  levothyroxine (SYNTHROID, LEVOTHROID) 50 MCG tablet Take 50 mcg by mouth daily before breakfast.   Yes [provider]  lisinopril (PRINIVIL,ZESTRIL) 10 MG tablet Take 10 mg by mouth at bedtime.    Yes [provider]  multivitamin-lutein (OCUVITE-LUTEIN) CAPS capsule Take 1 capsule by mouth daily.   Yes [provider]  mupirocin ointment (BACTROBAN) 2 % Apply 1 application topically 3 (three) times daily.   Yes [provider]  Omega 3-6-9 Fatty Acids (OMEGA 3-6-9 COMPLEX PO) Take 1 capsule by mouth 2 (two) times daily.   Yes [provider]  OVER THE COUNTER MEDICATION Take 1 tablet by mouth daily. Terra Naturally Healthy feet ans nerves   Yes [provider]  torsemide [facility])  20 MG tablet Take 20 mg by mouth daily.   Yes [provider]  warfarin (COUMADIN) 2.5 MG tablet Take 2.5 mg by mouth See admin instructions. Takes 2.5mg  on Monday, Wednesday and Saturday and friday Takes 5 mg on Sun tues. thurs   Yes [provider]  furosemide (LASIX) 20 MG tablet Take 1 tablet (20 mg total) by mouth daily. Take 2 tablets if increase in weight above 5 Lb Patient not taking: Reported on 07/14/2017 06/10/17 07/10/17  Doreatha Lew, MD    Physical Exam: Vitals:   07/14/17 2300 07/14/17 2311 07/14/17 2330 07/15/17 0107  BP:  134/77  (!) 141/62 (!) 155/62  Pulse: (!) 38  62   Resp: 17  15   Temp:      TempSrc:      SpO2: 95%  98%   Weight:  57.3 kg (126 lb 4.8 oz)      Constitutional: NAD, calm, comfortable Eyes: PERRL, lids and conjunctivae normal ENMT: Mucous membranes are moist. Posterior pharynx clear of any exudate or lesions.Normal dentition.  Neck: normal, supple, no masses, no thyromegaly Respiratory: clear to auscultation bilaterally, no wheezing, no crackles. Normal respiratory effort. No accessory muscle use.  Cardiovascular: Regular rate and rhythm, no murmurs / rubs / gallops. 2+ BLE pitting edema. 2+ pedal pulses. No carotid bruits.  Abdomen: no tenderness, no masses palpated. No hepatosplenomegaly. Bowel sounds positive.  Musculoskeletal: no clubbing / cyanosis. No joint deformity upper and lower extremities. Good ROM, no contractures. Normal muscle tone.  Skin: no rashes, lesions, ulcers. No induration Neurologic: CN 2-12 grossly intact. Sensation intact, DTR normal. Strength 5/5 in all 4.  Psychiatric: Normal judgment and insight. Alert and oriented x 3. Normal mood.    Labs on Admission: I have personally reviewed following labs and imaging studies  CBC: Recent Labs  Lab 07/14/17 1607  WBC 6.9  HGB 12.4  HCT 37.3  MCV 83.1  PLT 528   Basic Metabolic Panel: Recent Labs  Lab 07/14/17 1607  NA 136  K 4.0  CL 98*  CO2 25  GLUCOSE 115*  BUN 39*  CREATININE 1.06*  CALCIUM 9.1   GFR: Estimated Creatinine Clearance: 27.7 mL/min (A) (by C-G formula based on SCr of 1.06 mg/dL (H)). Liver Function Tests: No results for input(s): AST, ALT, ALKPHOS, BILITOT, PROT, ALBUMIN in the last 168 hours. No results for input(s): LIPASE, AMYLASE in the last 168 hours. No results for input(s): AMMONIA in the last 168 hours. Coagulation Profile: Recent Labs  Lab 07/14/17 1607  INR 2.55   Cardiac Enzymes: No results for input(s): CKTOTAL, CKMB, CKMBINDEX, TROPONINI in the last 168  hours. BNP (last 3 results) No results for input(s): PROBNP in the last 8760 hours. HbA1C: No results for input(s): HGBA1C in the last 72 hours. CBG: No results for input(s): GLUCAP in the last 168 hours. Lipid Profile: No results for input(s): CHOL, HDL, LDLCALC, TRIG, CHOLHDL, LDLDIRECT in the last 72 hours. Thyroid Function Tests: No results for input(s): TSH, T4TOTAL, FREET4, T3FREE, THYROIDAB in the last 72 hours. Anemia Panel: No results for input(s): VITAMINB12, FOLATE, FERRITIN, TIBC, IRON, RETICCTPCT in the last 72 hours. Urine analysis:    Component Value Date/Time   COLORURINE YELLOW 07/14/2017 [place] 07/14/2017 2313   LABSPEC 1.008 07/14/2017 2313   PHURINE 6.0 07/14/2017 2313   GLUCOSEU NEGATIVE 07/14/2017 2313   HGBUR NEGATIVE 07/14/2017 [place] NEGATIVE 07/14/2017 [place] 07/14/2017 2313  PROTEINUR NEGATIVE 07/14/2017 2313   NITRITE NEGATIVE 07/14/2017 2313   LEUKOCYTESUR MODERATE (A) 07/14/2017 2313    Radiological Exams on Admission: Dg Chest 2 View  Result Date: 07/14/2017 CLINICAL DATA:  Shortness of breath. EXAM: CHEST  2 VIEW COMPARISON:  06/08/2017 FINDINGS: The cardiac silhouette remains enlarged. Aortic atherosclerosis is noted. A small right pleural effusion is similar to the prior study, while the left-sided pleural effusion has decreased with only trace fluid remaining. There is compressive atelectasis in the right lung base. Left lower lobe aeration has improved. Suspected interstitial edema on the prior study has resolved. There is no evidence of new airspace consolidation or pneumothorax. No acute osseous abnormality is seen. IMPRESSION: 1. Persistent small right pleural effusion with associated atelectasis. 2. Near complete resolution of left pleural effusion with improved left basilar aeration. 3. Resolved edema. Electronically Signed   By: Logan Bores M.D.   On: 07/14/2017 17:39    EKG: Independently  reviewed.  Assessment/Plan Principal Problem:   Acute on chronic diastolic CHF (congestive heart failure) (HCC) Active Problems:   DM2 (diabetes mellitus, type 2) (HCC)   HTN (hypertension)   A-fib (HCC)   Acute on chronic diastolic (congestive) heart failure ([place])    1. Acute on chronic diastolic CHF - 1. CHF pathway 2. Lasix 40mg  IV BID 3. BMP daily 4. Strict intake and output 2. DM2 - 1. Continue 18 units long acting QAM (using lantus here) 2. Continue 1 unit per 15gm carb intake 3. And 1 unit for BGL 150-200. 3. HTN - 1. Continue lisinopril 4. A.Fib 1. Continue coreg 2. Continue coumadin  DVT prophylaxis: coumadin Code Status: Full Family Communication: Daughter at bedside Disposition Plan: Home after admit Consults called: EDP spoke with Dr. Einar Gip Admission status: Place in [place], [place] [facility] Pager [phone]  If 7AM-7PM, please contact day team taking care of patient [email or web address] Password [email or web address]  07/15/2017, 1:11 AM

## 2017-07-15 NOTE — Progress Notes (Addendum)
Patient remains bradycardiac. Denied dizziness or lightheadedness. She received am dose of coreg. Holding coreg now. Need to re-assess the need of coreg before discharge. Continue to monitor.  D/w RN

## 2017-07-16 ENCOUNTER — Inpatient Hospital Stay (HOSPITAL_COMMUNITY): Payer: Medicare HMO

## 2017-07-16 DIAGNOSIS — I5033 Acute on chronic diastolic (congestive) heart failure: Secondary | ICD-10-CM | POA: Diagnosis not present

## 2017-07-16 DIAGNOSIS — I10 Essential (primary) hypertension: Secondary | ICD-10-CM | POA: Diagnosis not present

## 2017-07-16 DIAGNOSIS — I482 Chronic atrial fibrillation: Secondary | ICD-10-CM | POA: Diagnosis not present

## 2017-07-16 DIAGNOSIS — R079 Chest pain, unspecified: Secondary | ICD-10-CM | POA: Diagnosis not present

## 2017-07-16 DIAGNOSIS — R001 Bradycardia, unspecified: Secondary | ICD-10-CM | POA: Diagnosis not present

## 2017-07-16 DIAGNOSIS — R0602 Shortness of breath: Secondary | ICD-10-CM | POA: Diagnosis not present

## 2017-07-16 LAB — URINALYSIS, ROUTINE W REFLEX MICROSCOPIC
Bilirubin Urine: NEGATIVE
Glucose, UA: NEGATIVE mg/dL
Hgb urine dipstick: NEGATIVE
Ketones, ur: NEGATIVE mg/dL
Leukocytes, UA: NEGATIVE
Nitrite: NEGATIVE
Protein, ur: NEGATIVE mg/dL
Specific Gravity, Urine: 1.006 (ref 1.005–1.030)
pH: 7 (ref 5.0–8.0)

## 2017-07-16 LAB — GLUCOSE, CAPILLARY
GLUCOSE-CAPILLARY: 100 mg/dL — AB (ref 65–99)
GLUCOSE-CAPILLARY: 122 mg/dL — AB (ref 65–99)
GLUCOSE-CAPILLARY: 46 mg/dL — AB (ref 65–99)
GLUCOSE-CAPILLARY: 76 mg/dL (ref 65–99)
Glucose-Capillary: 117 mg/dL — ABNORMAL HIGH (ref 65–99)
Glucose-Capillary: 56 mg/dL — ABNORMAL LOW (ref 65–99)
Glucose-Capillary: 74 mg/dL (ref 65–99)
Glucose-Capillary: 117 mg/dL — ABNORMAL HIGH (ref 65–99)
Glucose-Capillary: 43 mg/dL — CL (ref 65–99)
Glucose-Capillary: 223 mg/dL — ABNORMAL HIGH (ref 65–99)
Glucose-Capillary: 161 mg/dL — ABNORMAL HIGH (ref 65–99)
Glucose-Capillary: 184 mg/dL — ABNORMAL HIGH (ref 65–99)

## 2017-07-16 LAB — BLOOD GAS, ARTERIAL
Acid-Base Excess: 6 mmol/L — ABNORMAL HIGH (ref 0.0–2.0)
Bicarbonate: 29.4 mmol/L — ABNORMAL HIGH (ref 20.0–28.0)
Drawn by: 52077
FIO2: 21
O2 Saturation: 88.5 %
Patient temperature: 98.6
pCO2 arterial: 37.8 mmHg (ref 32.0–48.0)
pH, Arterial: 7.502 — ABNORMAL HIGH (ref 7.350–7.450)
pO2, Arterial: 55.8 mmHg — ABNORMAL LOW (ref 83.0–108.0)

## 2017-07-16 LAB — PROTIME-INR
INR: 3.04
PROTHROMBIN TIME: 31.3 s — AB (ref 11.4–15.2)

## 2017-07-16 LAB — BASIC METABOLIC PANEL
ANION GAP: 13 (ref 5–15)
BUN: 32 mg/dL — ABNORMAL HIGH (ref 6–20)
CALCIUM: 9 mg/dL (ref 8.9–10.3)
CHLORIDE: 98 mmol/L — AB (ref 101–111)
CO2: 24 mmol/L (ref 22–32)
Creatinine, Ser: 1.12 mg/dL — ABNORMAL HIGH (ref 0.44–1.00)
GFR calc non Af Amer: 43 mL/min — ABNORMAL LOW (ref >60)
GFR, EST AFRICAN AMERICAN: 49 mL/min — AB (ref >60)
Glucose, Bld: 118 mg/dL — ABNORMAL HIGH (ref 65–99)
POTASSIUM: 4 mmol/L (ref 3.5–5.1)
Sodium: 135 mmol/L (ref 135–145)

## 2017-07-16 LAB — URINE CULTURE: Culture: NO GROWTH

## 2017-07-16 LAB — BRAIN NATRIURETIC PEPTIDE: B Natriuretic Peptide: 553.6 pg/mL — ABNORMAL HIGH (ref 0.0–100.0)

## 2017-07-16 MED ORDER — IOPAMIDOL (ISOVUE-300) INJECTION 61%
INTRAVENOUS | Status: AC
  Administered 2017-07-16: 50 mL via INTRAVENOUS
  Filled 2017-07-16: qty 50

## 2017-07-16 NOTE — Progress Notes (Signed)
[place] for warfarin Indication: atrial fibrillation  Allergies  Allergen Reactions  . Cantaloupe (Diagnostic) Swelling    Patient Measurements: Weight: 124 lb (56.2 kg)   Vital Signs: Temp: 97.8 F (36.6 C) (01/26 0607) Temp Source: Oral (01/26 0607) BP: 132/56 (01/26 0607) Pulse Rate: 54 (01/26 0607)  Labs: Recent Labs    07/14/17 1607 07/15/17 0325 07/15/17 0942 07/16/17 0718  HGB 12.4  --   --   --   HCT 37.3  --   --   --   PLT 212  --   --   --   LABPROT 27.2* 26.4*  --  31.3*  INR 2.55 2.46  --  3.04  CREATININE 1.06*  --  0.96 1.12*    Estimated Creatinine Clearance: 26.2 mL/min (A) (by C-G formula based on SCr of 1.12 mg/dL (H)).   Medical History: Past Medical History:  Diagnosis Date  . A-fib ([place])   . Anemia   . Chronic diastolic CHF (congestive heart failure) ([place])   . Diabetic peripheral neuropathy ([place])    "not much feeling from feet up to my waist" (07/15/2017)  . History of blood transfusion ~ 2014   "low HgB" (07/15/2017)  . HTN (hypertension)   . Hypothyroidism   . PAF (paroxysmal atrial fibrillation) ([place])   . PONV (postoperative nausea and vomiting)   . TIA (transient ischemic attack) ~ 2014; 2015   "after receiving blood; just happened, not hospitalized" (07/15/2017)  . Type II diabetes mellitus ([place])    "was told I'm type 1 now cause Insulin production has stopped" (07/15/2017)    Assessment: 84 yo female on warfarin pta for AFib. She is here with HF and INR is slightly above goal and may trend up in setting of HF.   PTA dose: 5 mg q Sun/Tues/Thurs, 2.5 mg on all other days  Goal of Therapy:  INR 2-3 Monitor platelets by anticoagulation protocol: Yes    Plan:  -Hold coumadin today -daily INR  Hildred Laser, Pharm D 07/16/2017 11:35 AM

## 2017-07-16 NOTE — Progress Notes (Signed)
Subjective:  Feels much better.  Shortness of breath has improved.  Leg edema has decreased.  Sitting up in chair.  Objective:  Vital Signs in the last 24 hours: Temp:  [97.7 F (36.5 C)-97.9 F (36.6 C)] 97.8 F (36.6 C) (01/26 0607) Pulse Rate:  [44-60] 54 (01/26 0607) Resp:  [17-32] 18 (01/26 0607) BP: (116-148)/(54-61) 132/56 (01/26 0607) SpO2:  [90 %-100 %] 100 % (01/26 0607) Weight:  [56.2 kg (124 lb)] 56.2 kg (124 lb) (01/26 0607)  Intake/Output from previous day: 01/25 0701 - 01/26 0700 In: 120 [P.O.:120] Out: -  Intake/Output from this shift: No intake/output data recorded.  Physical Exam:  Physical Exam  Constitutional: She is oriented to person, place, and time. She appears well-developed and well-nourished. No distress.  HENT:  Head: Normocephalic.  Eyes: Conjunctivae are normal.  Neck: Normal range of motion. JVD present.  Cardiovascular:  Irregular rhythm, S1 variable, S2 is normal.  1/6 to 2/6 midsystolic murmur at the apex.  No gallop.  Pulmonary/Chest: Effort normal and breath sounds normal.  Abdominal: Soft. Bowel sounds are normal.  Musculoskeletal: Normal range of motion. She exhibits edema.  Neurological: She is alert and oriented to person, place, and time.  Skin: Skin is warm and dry. She is not diaphoretic.  Psychiatric: She has a normal mood and affect.   Lab Results: Recent Labs    07/14/17 1607  WBC 6.9  HGB 12.4  PLT 212   Recent Labs    07/15/17 0942 07/16/17 0718  NA 136 135  K 3.3* 4.0  CL 98* 98*  CO2 26 24  GLUCOSE 164* 118*  BUN 29* 32*  CREATININE 0.96 1.12*   Cardiac Studies: EKG 07/14/2017: Atrial fibrillation with controlled ventricular response at a rate of 57 beats a minute, normal axis, poor RV progression, cannot exclude anteroseptal infarct old. Nonspecific T abnormality. Normal QT interval.  Echocardiogram 06/09/2017: Normal LV size, mild LVH, EF 60-65%. Moderate tricuspid regurgitation, moderate pulmonary  hypertension, depression 47 mmHg.  Telemetry: 07/16/2017: Atrial fibrillation with controlled ventricular response.  Ventricular rate continues to remain low between 35-45 bpm.  Assessment/Plan:  1. Acute on chronic diastolic heart failure 2. Chronic atrial fibrillation CHA2DS2-VASCScore: Risk Score  5. Recommendation: ASA No/Anticoagulation Yes 3. Hypertension 4. Diabetes mellitus type 2 controlled without hyperglycemia with stage III chronic kidney disease, renal function has remained stable.  Recommendation: Heart failure symptoms have improved.  Continue IV diuretics today I will check BMP today and also tomorrow.  She can be discharged home tomorrow morning if she remains stable, convert her back to p.o. torsemide, continue spironolactone 25 mg daily, I did set her up to see me back in the office in 1 week. Blood pressure is well controlled, diabetes also well controlled.  Potassium levels have now normalized.  Discussed with the daughter at the bedside.  Daily weights home, monitoring salt intake was again discussed extensively.   LOS: 1 day   Adrian Prows, MD 07/16/2017, 11:42 AM [facility]. [place] Pager: [phone] Office: [phone] If no answer: Cell:  [phone]

## 2017-07-16 NOTE — Progress Notes (Addendum)
Daughter at the bedside stated she called her mother and her mother was slurring her speech on the phone and was confused. pt has a history of TIAs in the past. Spo2 81% placed on 02 2l; CBG 140s. Pt seems to to be clearing up. MD made aware. RRT notified.

## 2017-07-16 NOTE — Progress Notes (Signed)
Called by Rn to evaluate patient for some slurring of speech and confusion noted by daughter around 28.  I was with another critical patient at this time and recommended RN to call MD and would come to bedside when able.    I arrived to evaluate patient at 56, Daughter was at bedside.  Patietn states she still does not feel right but is better than before.  Patient and daughter endorse she has spells like this at home and the length of time varies after each episode.  On my assessment patient is slightly confused moving all extremities.  No focal deficits noted.  Recommended RN to monitor patient and touch base with MD

## 2017-07-16 NOTE — Progress Notes (Signed)
PROGRESS NOTE    Judy Kent  PPJ:[id number] DOB: [birth year] DOA: 07/14/2017 PCP: Reynold Bowen, MD  Outpatient Specialists:    Brief Narrative: Patient is an 84 y.o. female with medical history significant for A.Fib DM, HTN, diastolic CHF.  Patient was admitted with diastolic CHF 1 month ago.  Diuresed off 6L of fluid, dry wt 115 lbs at discharge.  Since discharge despite taking lasix as instructed wt, peripheral edema, DOE, has slowly and progressively returned.  Patient is admitted with acute on chronic diastolic congestive heart failure exacerbation.  Assessment & Plan:   Principal Problem:   Acute on chronic diastolic CHF (congestive heart failure) (HCC) Active Problems:   DM2 (diabetes mellitus, type 2) (HCC)   HTN (hypertension)   A-fib (HCC)   Acute on chronic diastolic (congestive) heart failure ([place])   Continue with diuresis. Strict I and os. With patient daily. Optimize blood sugar. Optimize blood pressure control. Further management will depend on hospital course   DVT prophylaxis: Coumadin. Code Status: Full. Family Communication:  Disposition Plan: This will depend on hospital course.   Consultants:   Cardiology.  Procedures:     Antimicrobials:      Subjective: Patient seen.  No new complaints.  Apparently, the patient is not at baseline.  Objective: Vitals:   07/15/17 1400 07/15/17 2032 07/16/17 0607 07/16/17 1300  BP: 123/61 (!) 148/60 (!) 132/56 138/61  Pulse: (!) 56 60 (!) 54 (!) 58  Resp: 20 18 18 18   Temp: 97.9 F (36.6 C) 97.7 F (36.5 C) 97.8 F (36.6 C) 98.2 F (36.8 C)  TempSrc: Oral Oral Oral Oral  SpO2: 96% 95% 100% 98%  Weight:   56.2 kg (124 lb)     Intake/Output Summary (Last 24 hours) at 07/16/2017 1608 Last data filed at 07/16/2017 1300 Gross per 24 hour  Intake 480 ml  Output -  Net 480 ml   Filed Weights   07/14/17 2311 07/16/17 0607  Weight: 57.3 kg (126 lb 4.8 oz) 56.2 kg (124 lb)     Examination:  General exam: Appears calm and comfortable  Respiratory system: Decreased air entry. Cardiovascular system: S1 & S2, irregular.  Gastrointestinal system: Abdomen is nondistended, soft and nontender. No organomegaly or masses felt. Normal bowel sounds heard. Central nervous system: Awake and alert.  Patient moves all extremities. Extremities: Leg edema.    Data Reviewed: I have personally reviewed following labs and imaging studies  CBC: Recent Labs  Lab 07/14/17 1607  WBC 6.9  HGB 12.4  HCT 37.3  MCV 83.1  PLT 580   Basic Metabolic Panel: Recent Labs  Lab 07/14/17 1607 07/15/17 0940 07/15/17 0942 07/16/17 0718  NA 136  --  136 135  K 4.0  --  3.3* 4.0  CL 98*  --  98* 98*  CO2 25  --  26 24  GLUCOSE 115*  --  164* 118*  BUN 39*  --  29* 32*  CREATININE 1.06*  --  0.96 1.12*  CALCIUM 9.1  --  8.9 9.0  MG  --  2.0  --   --    GFR: Estimated Creatinine Clearance: 26.2 mL/min (A) (by C-G formula based on SCr of 1.12 mg/dL (H)). Liver Function Tests: No results for input(s): AST, ALT, ALKPHOS, BILITOT, PROT, ALBUMIN in the last 168 hours. No results for input(s): LIPASE, AMYLASE in the last 168 hours. No results for input(s): AMMONIA in the last 168 hours. Coagulation Profile: Recent Labs  Lab 07/14/17 1607 07/15/17  4098 07/16/17 0718  INR 2.55 2.46 3.04   Cardiac Enzymes: No results for input(s): CKTOTAL, CKMB, CKMBINDEX, TROPONINI in the last 168 hours. BNP (last 3 results) No results for input(s): PROBNP in the last 8760 hours. HbA1C: No results for input(s): HGBA1C in the last 72 hours. CBG: Recent Labs  Lab 07/16/17 0639 07/16/17 0655 07/16/17 0711 07/16/17 0727 07/16/17 1110  GLUCAP 43* 46* 76 117* 223*   Lipid Profile: No results for input(s): CHOL, HDL, LDLCALC, TRIG, CHOLHDL, LDLDIRECT in the last 72 hours. Thyroid Function Tests: No results for input(s): TSH, T4TOTAL, FREET4, T3FREE, THYROIDAB in the last 72  hours. Anemia Panel: No results for input(s): VITAMINB12, FOLATE, FERRITIN, TIBC, IRON, RETICCTPCT in the last 72 hours. Urine analysis:    Component Value Date/Time   COLORURINE YELLOW 07/14/2017 [place] 07/14/2017 2313   LABSPEC 1.008 07/14/2017 2313   PHURINE 6.0 07/14/2017 2313   GLUCOSEU NEGATIVE 07/14/2017 2313   HGBUR NEGATIVE 07/14/2017 2313   BILIRUBINUR NEGATIVE 07/14/2017 2313   KETONESUR NEGATIVE 07/14/2017 2313   PROTEINUR NEGATIVE 07/14/2017 2313   NITRITE NEGATIVE 07/14/2017 2313   LEUKOCYTESUR MODERATE (A) 07/14/2017 2313   Sepsis Labs: @LABRCNTIP (procalcitonin:4,lacticidven:4)  ) Recent Results (from the past 240 hour(s))  Culture, Urine     Status: None   Collection Time: 07/14/17 11:13 PM  Result Value Ref Range Status   Specimen Description URINE, CLEAN CATCH  Final   Special Requests NONE  Final   Culture NO GROWTH  Final   Report Status 07/16/2017 FINAL  Final         Radiology Studies: Dg Chest 2 View  Result Date: 07/14/2017 CLINICAL DATA:  Shortness of breath. EXAM: CHEST  2 VIEW COMPARISON:  06/08/2017 FINDINGS: The cardiac silhouette remains enlarged. Aortic atherosclerosis is noted. A small right pleural effusion is similar to the prior study, while the left-sided pleural effusion has decreased with only trace fluid remaining. There is compressive atelectasis in the right lung base. Left lower lobe aeration has improved. Suspected interstitial edema on the prior study has resolved. There is no evidence of new airspace consolidation or pneumothorax. No acute osseous abnormality is seen. IMPRESSION: 1. Persistent small right pleural effusion with associated atelectasis. 2. Near complete resolution of left pleural effusion with improved left basilar aeration. 3. Resolved edema. Electronically Signed   By: Logan Bores M.D.   On: 07/14/2017 17:39        Scheduled Meds: . amLODipine  10 mg Oral Daily  . aspirin EC  81 mg Oral  Daily  . cholecalciferol  5,000 Units Oral Daily  . dextromethorphan-guaiFENesin  1 tablet Oral BID  . furosemide  40 mg Intravenous BID  . insulin aspart  0-15 Units Subcutaneous TID WC  . insulin glargine  18 Units Subcutaneous Daily  . levothyroxine  50 mcg Oral QAC breakfast  . lisinopril  10 mg Oral QHS  . multivitamin  1 tablet Oral Daily  . sodium chloride flush  3 mL Intravenous Q12H  . spironolactone  25 mg Oral Daily  . Warfarin - Pharmacist Dosing Inpatient   Does not apply q1800   Continuous Infusions: . sodium chloride       LOS: 1 day    Time spent: 40 mins    Dana Allan, MD  [facility] Pager #: [phone] 7PM-7AM contact night coverage as above

## 2017-07-16 NOTE — Progress Notes (Signed)
MD called orders received. Pt still not feeling like herself.

## 2017-07-17 DIAGNOSIS — R4781 Slurred speech: Secondary | ICD-10-CM | POA: Diagnosis not present

## 2017-07-17 DIAGNOSIS — I5033 Acute on chronic diastolic (congestive) heart failure: Secondary | ICD-10-CM | POA: Diagnosis not present

## 2017-07-17 DIAGNOSIS — R410 Disorientation, unspecified: Secondary | ICD-10-CM | POA: Diagnosis not present

## 2017-07-17 LAB — GLUCOSE, CAPILLARY
Glucose-Capillary: 114 mg/dL — ABNORMAL HIGH (ref 65–99)
Glucose-Capillary: 236 mg/dL — ABNORMAL HIGH (ref 65–99)

## 2017-07-17 LAB — PROTIME-INR
INR: 2.68
PROTHROMBIN TIME: 28.3 s — AB (ref 11.4–15.2)

## 2017-07-17 LAB — BRAIN NATRIURETIC PEPTIDE: B NATRIURETIC PEPTIDE 5: 630.1 pg/mL — AB (ref 0.0–100.0)

## 2017-07-17 MED ORDER — WARFARIN SODIUM 5 MG PO TABS
5.0000 mg | ORAL_TABLET | Freq: Once | ORAL | Status: DC
  Filled 2017-07-17: qty 1

## 2017-07-17 MED ORDER — TORSEMIDE 20 MG PO TABS: 20.0000 mg | ORAL_TABLET | Freq: Two times a day (BID) | ORAL | 0 refills | Status: DC

## 2017-07-17 NOTE — Discharge Summary (Signed)
Physician Discharge Summary  Patient ID: Judy Kent MRN: [id number] DOB/AGE: April 30, 1927 84 y.o.  Admit date: 07/14/2017 Discharge date: 07/17/2017  Admission Diagnoses:  Discharge Diagnoses:  Principal Problem:   Acute on chronic diastolic CHF (congestive heart failure) ([place]) Active Problems:   DM2 (diabetes mellitus, type 2) (HCC)   HTN (hypertension)   A-fib (HCC)   Acute on chronic diastolic (congestive) heart failure ([place])   Discharged Condition: Stable.  Hospital Course: Patient is an 84 year old [place] past medical history significant forA.Fib DM, HTN, diastolic CHF. Patient was admitted with diastolic CHF about a month ago, diuresed off 6L of fluid, and discharged with a dry weight of 115 pounds. Since discharge, despite taking lasix as instructed, weight, peripheral edema, dyspnea on exertion have slowly and progressively returned.  Patient was admitted with acute on chronic diastolic congestive heart failure exacerbation.  Patient was admitted for further assessment and management.  On admission, the patient was started on IV Lasix 40 mg twice daily.  Shortness of breath has improved.  Dyspnea on exertion has resolved.  Leg edema has resolved significantly.  Apparently, prior to admission, the patient was on torsemide 20 mg p.o. once daily.  Torsemide will be increased to 20 mg p.o. twice daily (20 mg in the morning and 20 mg in the afternoon).  Patient has been advised to reviewed the need to continue on torsemide twice daily with the cardiologist on the primary care provider.  The patient's primary cardiologist is Dr. Einar Gip.  The patient has been optimized, and is eager to be discharged back home today.  Consults: Cardiology consult done by Dr. Einar Gip.  Significant Diagnostic Studies: labs: Elevated cardiac BNP. CXR: Decreased left pleural effusion with small residual. Continued small moderate right pleural effusion with right basilar atelectasis or  infiltrate.  Cardiomegaly   Treatments: See above.  Discharge Exam: Blood pressure 131/67, pulse (!) 50, temperature 97.7 F (36.5 C), temperature source Oral, resp. rate 18, weight 55.7 kg (122 lb 12.8 oz), SpO2 94 %.   Disposition: 01-Home or Self Care  Discharge Instructions    Call MD for:   Complete by:  As directed    Call MD with worsening of symptoms   Diet - low sodium heart healthy   Complete by:  As directed    Discharge instructions   Complete by:  As directed    Please discuss need to continue Torsemide Bid with the PCP and Cardiology within 1 week   Increase activity slowly   Complete by:  As directed      Allergies as of 07/17/2017      Reactions   Cantaloupe (diagnostic) Swelling      Medication List    STOP taking these medications   Bilberry 500 MG Caps   carvedilol 6.25 MG tablet Commonly known as:  COREG   furosemide 20 MG tablet Commonly known as:  LASIX   OVER THE COUNTER MEDICATION     TAKE these medications   amLODipine 10 MG tablet Commonly known as:  NORVASC Take 10 mg by mouth daily.   aspirin EC 81 MG tablet Take 81 mg by mouth daily.   FERREX 28 Misc Take 28 mg by mouth every other day.   HUMALOG 100 UNIT/ML cartridge Generic drug:  insulin lispro Inject 1 Units into the skin See admin instructions. 1 unit per 15 carbs per sliding scale,adding  1 unit if BS is 150-200.   insulin degludec 100 UNIT/ML Sopn FlexTouch Pen Commonly known as:  TRESIBA FLEXTOUCH Inject 0.2 mLs (20 Units total) into the skin daily. What changed:  how much to take   levothyroxine 50 MCG tablet Commonly known as:  SYNTHROID, LEVOTHROID Take 50 mcg by mouth daily before breakfast.   lisinopril 10 MG tablet Commonly known as:  PRINIVIL,ZESTRIL Take 10 mg by mouth at bedtime.   multivitamin-lutein Caps capsule Take 1 capsule by mouth daily.   mupirocin ointment 2 % Commonly known as:  BACTROBAN Apply 1 application topically 3 (three) times  daily.   OMEGA 3-6-9 COMPLEX PO Take 1 capsule by mouth 2 (two) times daily.   torsemide 20 MG tablet Commonly known as:  DEMADEX Take 1 tablet (20 mg total) by mouth 2 (two) times daily. Take one in the morning and one in the afternoon. What changed:    when to take this  additional instructions   Vitamin D3 5000 units Caps Take 5,000 Units by mouth daily.   warfarin 2.5 MG tablet Commonly known as:  COUMADIN Take 2.5 mg by mouth See admin instructions. Takes 2.5mg  on Monday, Wednesday and Saturday and friday Takes 5 mg on Sun tues. thurs        Signed: Bonnell Public 07/17/2017, 3:11 PM

## 2017-07-17 NOTE — Progress Notes (Signed)
Pt ready for discharge to home. Daughter present to provided transportation. All belongings with pt. Discharge instructions, HH follow up, MD follow up reviewed.  Pt demonstrates no distess.

## 2017-07-17 NOTE — Care Management Note (Signed)
Case Management Note Previous CM note completed by Royston Bake, RN 07/15/2017, 2:21 PM  Patient Details  Name: Judy Kent MRN: [id number] Date of Birth: 1927-03-26  Subjective/Objective:   CHF                Action/Plan: 07/15/2017 - CM following for progression of care; awaiting for Physical Therapy eval for disposition needs; Aneta Mins  06/10/2018 - Action/Plan: Pt for discharge today.  In to speak with PT, daughter at bedside. Prior to admission Pt lived home with daughter.  PCP Noted.  Home DME equipment includes: 3 in 1, Shower chair. Uses [facility] on [address], [place], [place].  Pt denies inability to afford medications or Food/Nutrition.  Pt uses daughter for transportation  to go shopping and medical appointments. No discharge needs noted.Mauricio Po RN CM     Expected Discharge Date:  07/17/17               Expected Discharge Plan:  [place]  In-House Referral:  NA  Discharge planning Services  CM Consult  Post Acute Care Choice:  Home Health Choice offered to:  Patient, Adult Children  DME Arranged:    DME Agency:     HH Arranged:  PT [place]:  [place]  Status of Service:  Completed, signed off  If discussed at [place] of Stay Meetings, dates discussed:    Discharge Disposition: home/home health   Additional Comments:  07/17/17- 1545- Marvetta Gibbons RN, CM- pt for d/c home today with daughter- order placed for HHPT- in to speak with pt and daughter- choice offered for [facility] agency- per choice they have chosen [facility] for [facility] services- referral called to [place] with [facility]- referral accepted-   Dahlia Client Romeo Rabon, RN 07/17/2017, 3:49 PM [phone] 3E weekend coverage

## 2017-07-17 NOTE — Progress Notes (Signed)
[place] for warfarin Indication: atrial fibrillation  Allergies  Allergen Reactions  . Cantaloupe (Diagnostic) Swelling    Patient Measurements: Weight: 122 lb 12.8 oz (55.7 kg)(scale b)   Vital Signs: Temp: 97.7 F (36.5 C) (01/27 0546) Temp Source: Oral (01/27 0546) BP: 131/67 (01/27 0546) Pulse Rate: 50 (01/27 0546)  Labs: Recent Labs    07/14/17 1607 07/15/17 0325 07/15/17 0942 07/16/17 0718 07/17/17 0708  HGB 12.4  --   --   --   --   HCT 37.3  --   --   --   --   PLT 212  --   --   --   --   LABPROT 27.2* 26.4*  --  31.3* 28.3*  INR 2.55 2.46  --  3.04 2.68  CREATININE 1.06*  --  0.96 1.12*  --     Estimated Creatinine Clearance: 26.2 mL/min (A) (by C-G formula based on SCr of 1.12 mg/dL (H)).   Medical History: Past Medical History:  Diagnosis Date  . A-fib ([place])   . Anemia   . Chronic diastolic CHF (congestive heart failure) ([place])   . Diabetic peripheral neuropathy ([place])    "not much feeling from feet up to my waist" (07/15/2017)  . History of blood transfusion ~ 2014   "low HgB" (07/15/2017)  . HTN (hypertension)   . Hypothyroidism   . PAF (paroxysmal atrial fibrillation) ([place])   . PONV (postoperative nausea and vomiting)   . TIA (transient ischemic attack) ~ 2014; 2015   "after receiving blood; just happened, not hospitalized" (07/15/2017)  . Type II diabetes mellitus ([place])    "was told I'm type 1 now cause Insulin production has stopped" (07/15/2017)    Assessment: 84 yo female on warfarin pta for AFib. She is here with HF and INR down from 1/26 and in goal  PTA dose: 5 mg q Sun/Tues/Thurs, 2.5 mg on all other days  Goal of Therapy:  INR 2-3 Monitor platelets by anticoagulation protocol: Yes    Plan:  -Coumadin 5mg  po today -daily INR  Hildred Laser, Pharm D 07/17/2017 11:38 AM

## 2017-07-18 DIAGNOSIS — I4891 Unspecified atrial fibrillation: Secondary | ICD-10-CM | POA: Diagnosis not present

## 2017-07-18 DIAGNOSIS — Z7901 Long term (current) use of anticoagulants: Secondary | ICD-10-CM | POA: Diagnosis not present

## 2017-07-18 DIAGNOSIS — I5033 Acute on chronic diastolic (congestive) heart failure: Secondary | ICD-10-CM | POA: Diagnosis not present

## 2017-07-18 DIAGNOSIS — Z794 Long term (current) use of insulin: Secondary | ICD-10-CM | POA: Diagnosis not present

## 2017-07-18 DIAGNOSIS — I110 Hypertensive heart disease with heart failure: Secondary | ICD-10-CM | POA: Diagnosis not present

## 2017-07-18 DIAGNOSIS — E119 Type 2 diabetes mellitus without complications: Secondary | ICD-10-CM | POA: Diagnosis not present

## 2017-07-18 LAB — URINE CULTURE: Culture: <10000 — AB

## 2017-07-20 DIAGNOSIS — I482 Chronic atrial fibrillation: Secondary | ICD-10-CM | POA: Diagnosis not present

## 2017-07-20 DIAGNOSIS — I5033 Acute on chronic diastolic (congestive) heart failure: Secondary | ICD-10-CM | POA: Diagnosis not present

## 2017-07-20 DIAGNOSIS — I10 Essential (primary) hypertension: Secondary | ICD-10-CM | POA: Diagnosis not present

## 2017-07-20 DIAGNOSIS — I872 Venous insufficiency (chronic) (peripheral): Secondary | ICD-10-CM | POA: Diagnosis not present

## 2017-07-22 DIAGNOSIS — E1021 Type 1 diabetes mellitus with diabetic nephropathy: Secondary | ICD-10-CM | POA: Diagnosis not present

## 2017-07-22 DIAGNOSIS — I480 Paroxysmal atrial fibrillation: Secondary | ICD-10-CM | POA: Diagnosis not present

## 2017-07-22 DIAGNOSIS — E038 Other specified hypothyroidism: Secondary | ICD-10-CM | POA: Diagnosis not present

## 2017-07-22 DIAGNOSIS — E1142 Type 2 diabetes mellitus with diabetic polyneuropathy: Secondary | ICD-10-CM | POA: Diagnosis not present

## 2017-07-22 DIAGNOSIS — E7849 Other hyperlipidemia: Secondary | ICD-10-CM | POA: Diagnosis not present

## 2017-07-22 DIAGNOSIS — I5033 Acute on chronic diastolic (congestive) heart failure: Secondary | ICD-10-CM | POA: Diagnosis not present

## 2017-07-22 DIAGNOSIS — Z681 Body mass index (BMI) 19 or less, adult: Secondary | ICD-10-CM | POA: Diagnosis not present

## 2017-07-22 DIAGNOSIS — I10 Essential (primary) hypertension: Secondary | ICD-10-CM | POA: Diagnosis not present

## 2017-07-22 DIAGNOSIS — N08 Glomerular disorders in diseases classified elsewhere: Secondary | ICD-10-CM | POA: Diagnosis not present

## 2017-07-22 DIAGNOSIS — Z794 Long term (current) use of insulin: Secondary | ICD-10-CM | POA: Diagnosis not present

## 2017-08-12 DIAGNOSIS — Z7901 Long term (current) use of anticoagulants: Secondary | ICD-10-CM | POA: Diagnosis not present

## 2017-08-24 DIAGNOSIS — B9689 Other specified bacterial agents as the cause of diseases classified elsewhere: Secondary | ICD-10-CM | POA: Diagnosis not present

## 2017-08-24 DIAGNOSIS — L02425 Furuncle of right lower limb: Secondary | ICD-10-CM | POA: Diagnosis not present

## 2017-09-01 DIAGNOSIS — R600 Localized edema: Secondary | ICD-10-CM | POA: Diagnosis not present

## 2017-09-01 DIAGNOSIS — I5032 Chronic diastolic (congestive) heart failure: Secondary | ICD-10-CM | POA: Diagnosis not present

## 2017-09-01 DIAGNOSIS — Z7901 Long term (current) use of anticoagulants: Secondary | ICD-10-CM | POA: Diagnosis not present

## 2017-09-01 DIAGNOSIS — I482 Chronic atrial fibrillation: Secondary | ICD-10-CM | POA: Diagnosis not present

## 2017-09-05 DIAGNOSIS — M79675 Pain in left toe(s): Secondary | ICD-10-CM | POA: Diagnosis not present

## 2017-09-05 DIAGNOSIS — I739 Peripheral vascular disease, unspecified: Secondary | ICD-10-CM | POA: Diagnosis not present

## 2017-09-05 DIAGNOSIS — B351 Tinea unguium: Secondary | ICD-10-CM | POA: Diagnosis not present

## 2017-09-05 DIAGNOSIS — M79674 Pain in right toe(s): Secondary | ICD-10-CM | POA: Diagnosis not present

## 2017-09-06 DIAGNOSIS — Z6820 Body mass index (BMI) 20.0-20.9, adult: Secondary | ICD-10-CM | POA: Diagnosis not present

## 2017-09-06 DIAGNOSIS — Z794 Long term (current) use of insulin: Secondary | ICD-10-CM | POA: Diagnosis not present

## 2017-09-06 DIAGNOSIS — N08 Glomerular disorders in diseases classified elsewhere: Secondary | ICD-10-CM | POA: Diagnosis not present

## 2017-09-06 DIAGNOSIS — E1021 Type 1 diabetes mellitus with diabetic nephropathy: Secondary | ICD-10-CM | POA: Diagnosis not present

## 2017-09-06 DIAGNOSIS — B9689 Other specified bacterial agents as the cause of diseases classified elsewhere: Secondary | ICD-10-CM | POA: Diagnosis not present

## 2017-09-06 DIAGNOSIS — I10 Essential (primary) hypertension: Secondary | ICD-10-CM | POA: Diagnosis not present

## 2017-09-06 DIAGNOSIS — L02425 Furuncle of right lower limb: Secondary | ICD-10-CM | POA: Diagnosis not present

## 2017-09-26 ENCOUNTER — Encounter: Payer: Self-pay | Admitting: *Deleted

## 2017-09-27 ENCOUNTER — Ambulatory Visit (INDEPENDENT_AMBULATORY_CARE_PROVIDER_SITE_OTHER): Payer: Medicare HMO | Admitting: Diagnostic Neuroimaging

## 2017-09-27 ENCOUNTER — Encounter: Payer: Self-pay | Admitting: Diagnostic Neuroimaging

## 2017-09-27 VITALS — BP 140/66 | HR 61 | Ht 60.0 in | Wt 118.6 lb

## 2017-09-27 DIAGNOSIS — R413 Other amnesia: Secondary | ICD-10-CM | POA: Diagnosis not present

## 2017-09-27 NOTE — Progress Notes (Signed)
[facility]  PATIENT: Judy Kent DOB: 1927/06/17  REFERRING CLINICIAN: Baldwin Crown, MD  HISTORY FROM: patient  REASON FOR VISIT: new consult    HISTORICAL  CHIEF COMPLAINT:  Chief Complaint  Patient presents with  . Dementia    rm 7, New Pt, dgtrHinton Dyer, MMSE 21    HISTORY OF PRESENT ILLNESS:   84 year old female with hypertension, diabetes, heart disease, atrial fibrillation, here for evaluation of dementia.  Patient was living in [place], visiting [place] every few months.  Patient came to [place] In October 2018.  She has been to the hospital in January 2019 for CHF exacerbation, shortness of breath, confusion.  She has been through rehabilitation and home physical therapy.  She was planning to go back to [place], but now family is recommending that she stay here in [place].  Patient is living with her daughter.    Patient has noted some mild memory loss, short-term difficulties, for past 6-12 months.  Daughter has also noticed similar problems.  MMSE 21/30. Bubba Camp instrumental activities of daily living score is 4. Ron Parker index independence in activities of daily living a 6.    REVIEW OF SYSTEMS: Full 14 system review of systems performed and negative with exception of: Memory loss swelling of legs.  ALLERGIES: Allergies  Allergen Reactions  . Cantaloupe (Diagnostic) Swelling    HOME MEDICATIONS: Outpatient Medications Prior to Visit  Medication Sig Dispense Refill  . amLODipine (NORVASC) 10 MG tablet Take 10 mg by mouth daily.    Marland Kitchen apixaban (ELIQUIS) 2.5 MG TABS tablet Take 2.5 mg by mouth 2 (two) times daily.    . Cholecalciferol (VITAMIN D3) 5000 units CAPS Take 5,000 Units by mouth daily.    . FeAspGl-FeFum-B12-FA-C-Succ Ac (FERREX 28) MISC Take 28 mg by mouth every other day.    . insulin degludec (TRESIBA FLEXTOUCH) 100 UNIT/ML SOPN FlexTouch Pen Inject 0.2 mLs (20 Units total) into the skin daily. (Patient taking  differently: Inject 18 Units into the skin daily. )    . insulin lispro (HUMALOG) 100 UNIT/ML cartridge Inject 1 Units into the skin See admin instructions. 1 unit per 15 carbs per sliding scale,adding  1 unit if BS is 150-200.    Marland Kitchen levothyroxine (SYNTHROID, LEVOTHROID) 50 MCG tablet Take 50 mcg by mouth daily before breakfast.    . lisinopril (PRINIVIL,ZESTRIL) 10 MG tablet Take 10 mg by mouth at bedtime.     . multivitamin-lutein (OCUVITE-LUTEIN) CAPS capsule Take 1 capsule by mouth daily.    . mupirocin ointment (BACTROBAN) 2 % Apply 1 application topically 3 (three) times daily.    . Omega 3-6-9 Fatty Acids (OMEGA 3-6-9 COMPLEX PO) Take 1 capsule by mouth 2 (two) times daily.    Marland Kitchen torsemide (DEMADEX) 20 MG tablet Take 1 tablet (20 mg total) by mouth 2 (two) times daily. Take one in the morning and one in the afternoon. 60 tablet 0  . aspirin EC 81 MG tablet Take 81 mg by mouth daily.    Marland Kitchen warfarin (COUMADIN) 2.5 MG tablet Take 2.5 mg by mouth See admin instructions. Takes 2.5mg  on Monday, Wednesday and Saturday and friday Takes 5 mg on Sun tues. thurs     No facility-administered medications prior to visit.     PAST MEDICAL HISTORY: Past Medical History:  Diagnosis Date  . A-fib ([place])   . Anemia   . Chronic diastolic CHF (congestive heart failure) ([place])   . Dementia   . Diabetic peripheral neuropathy ([place])    "  not much feeling from feet up to my waist" (07/15/2017)  . History of blood transfusion ~ 2014   "low HgB" (07/15/2017)  . HTN (hypertension)   . Hypothyroidism   . PAF (paroxysmal atrial fibrillation) ([place])   . PONV (postoperative nausea and vomiting)   . TIA (transient ischemic attack) ~ 2014; 2015   "after receiving blood; just happened, not hospitalized" (07/15/2017)  . Type II diabetes mellitus ([place])    "was told I'm type 1 now cause Insulin production has stopped" (07/15/2017)    PAST SURGICAL HISTORY: Past Surgical History:  Procedure Laterality Date  . CATARACT  EXTRACTION, BILATERAL Bilateral   . FRACTURE SURGERY    . TIBIA FRACTURE SURGERY Left    "I have 9 screws in there"  . TONSILLECTOMY      FAMILY HISTORY: Family History  Problem Relation Age of Onset  . Heart attack Other 33  . Lung cancer Father     SOCIAL HISTORY:  Social History   Socioeconomic History  . Marital status: Widowed    Spouse name: Not on file  . Number of children: 5  . Years of education: Not on file  . 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  Social Needs  . Financial resource strain: Not on file  . Food insecurity:    Worry: Not on file    Inability: Not on file  . Transportation needs:    Medical: Not on file    Non-medical: Not on file  Tobacco Use  . Smoking status: Never Smoker  . Smokeless tobacco: Never Used  Substance and Sexual Activity  . Alcohol use: No    Frequency: Never  . Drug use: No  . Sexual activity: Not on file  Lifestyle  . Physical activity:    Days per week: Not on file    Minutes per session: Not on file  . Stress: Not on file  Relationships  . Social connections:    Talks on phone: Not on file    Gets together: Not on file    Attends religious service: Not on file    Active member of club or organization: Not on file    Attends meetings of clubs or organizations: Not on file    Relationship status: Not on file  . Intimate partner violence:    Fear of current or ex partner: Not on file    Emotionally abused: Not on file    Physically abused: Not on file    Forced sexual activity: Not on file  Other Topics Concern  . Not on file  Social History Narrative   From [place] with daughter   Children- 5   Education- high school     PHYSICAL EXAM  GENERAL EXAM/CONSTITUTIONAL: Vitals:  Vitals:   09/27/17 1418  BP: 140/66  Pulse: 61  Weight: 118 lb 9.6 oz (53.8 kg)  Height: 5' (1.524 m)     Body mass index is 23.16 kg/m.  Vision Screening  Comments: Unable to complete, bifocals  Patient is in no distress; well developed, nourished and groomed; neck is supple  CARDIOVASCULAR:  Examination of carotid arteries is normal; no carotid bruits  Regular rate and rhythm, no murmurs  Examination of peripheral vascular system by observation and palpation is normal  EYES:  Ophthalmoscopic exam of optic discs and posterior segments is normal; no papilledema or hemorrhages  MUSCULOSKELETAL:  Gait, strength, tone, movements noted in  Neurologic exam below  NEUROLOGIC: MENTAL STATUS:  MMSE - Mini Mental State Exam 09/27/2017  Orientation to time 3  Orientation to Place 3  Registration 3  Attention/ Calculation 2  Recall 1  Language- name 2 objects 2  Language- repeat 1  Language- follow 3 step command 3  Language- read & follow direction 1  Write a sentence 1  Copy design 1  Total score 21    awake, alert, oriented to person  [place] attention and concentration  language fluent, comprehension intact, naming intact,   fund of knowledge appropriate   CRANIAL NERVE:   2nd - no papilledema on fundoscopic exam  2nd, 3rd, 4th, 6th - pupils equal and reactive to light, visual fields full to confrontation, extraocular muscles intact, no nystagmus  5th - facial sensation symmetric  7th - facial strength symmetric  8th - hearing intact  9th - palate elevates symmetrically, uvula midline  11th - shoulder shrug symmetric  12th - tongue protrusion midline  MOTOR:   normal bulk and tone, full strength in the BUE, BLE  SENSORY:   normal and symmetric to light touch, temperature, vibration  COORDINATION:   finger-nose-finger, fine finger movements normal  REFLEXES:   deep tendon reflexes TRACE and symmetric  GAIT/STATION:   narrow based gait; USES WALKER    DIAGNOSTIC DATA (LABS, IMAGING, TESTING) - I reviewed patient records, labs, notes, testing and imaging myself where available.  Lab  Results  Component Value Date   WBC 6.9 07/14/2017   HGB 12.4 07/14/2017   HCT 37.3 07/14/2017   MCV 83.1 07/14/2017   PLT 212 07/14/2017      Component Value Date/Time   NA 135 07/16/2017 0718   K 4.0 07/16/2017 0718   CL 98 (L) 07/16/2017 0718   CO2 24 07/16/2017 0718   GLUCOSE 118 (H) 07/16/2017 0718   BUN 32 (H) 07/16/2017 0718   CREATININE 1.12 (H) 07/16/2017 0718   CALCIUM 9.0 07/16/2017 0718   ALBUMIN 3.2 (L) 06/09/2017 0238   GFRNONAA 43 (L) 07/16/2017 0718   GFRAA 49 (L) 07/16/2017 0718   No results found for: CHOL, HDL, LDLCALC, LDLDIRECT, TRIG, CHOLHDL Lab Results  Component Value Date   HGBA1C 7.9 (H) 06/08/2017   No results found for: VITAMINB12 No results found for: TSH   07/16/17 CT head  [I reviewed images myself and agree with interpretation. -VRP]  - Chronic white matter disease, likely indicating chronic ischemic microangiopathy. No acute abnormality. No abnormal enhancement.     ASSESSMENT AND PLAN  84 y.o. year old female here with gradual onset progressive short-term memory loss, confusion, decline in activities of daily living, most consistent with mild neurodegenerative dementia.   Dx: mild dementia vs MCI  1. Memory loss      PLAN:  I spent 60 minutes of face to face time with patient. Greater than 50% of time was spent in counseling and coordination of care with patient. In summary we discussed:   - check B12 level - may consider memantine, but due to age and other comorbid medical issues, patient would like to decline at this time - increase safety / supervision - caregiver resources provided and reviewed - consider dementia research studies  Orders Placed This Encounter  Procedures  . Vitamin B12   Return if symptoms worsen or fail to improve, for return to PCP. return as needed    Penni Bombard, MD 0/02/3266, 1:24 PM Certified in Neurology, Neurophysiology and Neuroimaging  [facility] [address], [place], [place] [address] [phone]

## 2017-09-27 NOTE — Patient Instructions (Signed)
-   check B12 level  - consider memantine  - increase safety / supervision  - review caregiver resources  - consider dementia research studies

## 2017-09-28 ENCOUNTER — Telehealth: Payer: Self-pay | Admitting: *Deleted

## 2017-09-28 LAB — VITAMIN B12: Vitamin B-12: 333 pg/mL (ref 232–1245)

## 2017-09-28 NOTE — Telephone Encounter (Signed)
Spoke with daughter, Hinton Dyer, on [place] and informed her that her mother's Vit B12 level is normal. She verbalized understanding, appreciation.

## 2017-10-04 DIAGNOSIS — L02425 Furuncle of right lower limb: Secondary | ICD-10-CM | POA: Diagnosis not present

## 2017-10-04 DIAGNOSIS — B9689 Other specified bacterial agents as the cause of diseases classified elsewhere: Secondary | ICD-10-CM | POA: Diagnosis not present

## 2017-10-11 DIAGNOSIS — R69 Illness, unspecified: Secondary | ICD-10-CM | POA: Diagnosis not present

## 2017-10-11 DIAGNOSIS — E1142 Type 2 diabetes mellitus with diabetic polyneuropathy: Secondary | ICD-10-CM | POA: Diagnosis not present

## 2017-10-11 DIAGNOSIS — Z794 Long term (current) use of insulin: Secondary | ICD-10-CM | POA: Diagnosis not present

## 2017-10-11 DIAGNOSIS — I10 Essential (primary) hypertension: Secondary | ICD-10-CM | POA: Diagnosis not present

## 2017-10-11 DIAGNOSIS — E1151 Type 2 diabetes mellitus with diabetic peripheral angiopathy without gangrene: Secondary | ICD-10-CM | POA: Diagnosis not present

## 2017-10-11 DIAGNOSIS — I739 Peripheral vascular disease, unspecified: Secondary | ICD-10-CM | POA: Diagnosis not present

## 2017-10-11 DIAGNOSIS — N08 Glomerular disorders in diseases classified elsewhere: Secondary | ICD-10-CM | POA: Diagnosis not present

## 2017-10-11 DIAGNOSIS — E1021 Type 1 diabetes mellitus with diabetic nephropathy: Secondary | ICD-10-CM | POA: Diagnosis not present

## 2017-10-11 DIAGNOSIS — I480 Paroxysmal atrial fibrillation: Secondary | ICD-10-CM | POA: Diagnosis not present

## 2017-10-11 DIAGNOSIS — I5033 Acute on chronic diastolic (congestive) heart failure: Secondary | ICD-10-CM | POA: Diagnosis not present

## 2017-11-17 DIAGNOSIS — I5032 Chronic diastolic (congestive) heart failure: Secondary | ICD-10-CM | POA: Diagnosis not present

## 2017-11-17 DIAGNOSIS — Z0189 Encounter for other specified special examinations: Secondary | ICD-10-CM | POA: Diagnosis not present

## 2017-11-17 DIAGNOSIS — I482 Chronic atrial fibrillation: Secondary | ICD-10-CM | POA: Diagnosis not present

## 2017-11-17 DIAGNOSIS — I10 Essential (primary) hypertension: Secondary | ICD-10-CM | POA: Diagnosis not present

## 2017-11-21 DIAGNOSIS — B351 Tinea unguium: Secondary | ICD-10-CM | POA: Diagnosis not present

## 2017-11-21 DIAGNOSIS — I739 Peripheral vascular disease, unspecified: Secondary | ICD-10-CM | POA: Diagnosis not present

## 2017-11-21 DIAGNOSIS — M79674 Pain in right toe(s): Secondary | ICD-10-CM | POA: Diagnosis not present

## 2017-11-21 DIAGNOSIS — E1151 Type 2 diabetes mellitus with diabetic peripheral angiopathy without gangrene: Secondary | ICD-10-CM | POA: Diagnosis not present

## 2017-12-09 DIAGNOSIS — H3563 Retinal hemorrhage, bilateral: Secondary | ICD-10-CM | POA: Diagnosis not present

## 2017-12-09 DIAGNOSIS — E103293 Type 1 diabetes mellitus with mild nonproliferative diabetic retinopathy without macular edema, bilateral: Secondary | ICD-10-CM | POA: Diagnosis not present

## 2017-12-09 DIAGNOSIS — H43813 Vitreous degeneration, bilateral: Secondary | ICD-10-CM | POA: Diagnosis not present

## 2017-12-09 DIAGNOSIS — H35371 Puckering of macula, right eye: Secondary | ICD-10-CM | POA: Diagnosis not present

## 2017-12-15 DIAGNOSIS — N08 Glomerular disorders in diseases classified elsewhere: Secondary | ICD-10-CM | POA: Diagnosis not present

## 2017-12-15 DIAGNOSIS — Z794 Long term (current) use of insulin: Secondary | ICD-10-CM | POA: Diagnosis not present

## 2017-12-15 DIAGNOSIS — E038 Other specified hypothyroidism: Secondary | ICD-10-CM | POA: Diagnosis not present

## 2017-12-15 DIAGNOSIS — I10 Essential (primary) hypertension: Secondary | ICD-10-CM | POA: Diagnosis not present

## 2017-12-15 DIAGNOSIS — E1151 Type 2 diabetes mellitus with diabetic peripheral angiopathy without gangrene: Secondary | ICD-10-CM | POA: Diagnosis not present

## 2017-12-15 DIAGNOSIS — E7849 Other hyperlipidemia: Secondary | ICD-10-CM | POA: Diagnosis not present

## 2017-12-15 DIAGNOSIS — I480 Paroxysmal atrial fibrillation: Secondary | ICD-10-CM | POA: Diagnosis not present

## 2018-01-30 DIAGNOSIS — M79674 Pain in right toe(s): Secondary | ICD-10-CM | POA: Diagnosis not present

## 2018-01-30 DIAGNOSIS — I739 Peripheral vascular disease, unspecified: Secondary | ICD-10-CM | POA: Diagnosis not present

## 2018-01-30 DIAGNOSIS — E1151 Type 2 diabetes mellitus with diabetic peripheral angiopathy without gangrene: Secondary | ICD-10-CM | POA: Diagnosis not present

## 2018-01-30 DIAGNOSIS — B351 Tinea unguium: Secondary | ICD-10-CM | POA: Diagnosis not present

## 2018-02-09 DIAGNOSIS — H5203 Hypermetropia, bilateral: Secondary | ICD-10-CM | POA: Diagnosis not present

## 2018-02-09 DIAGNOSIS — H0011 Chalazion right upper eyelid: Secondary | ICD-10-CM | POA: Diagnosis not present

## 2018-02-09 DIAGNOSIS — H01001 Unspecified blepharitis right upper eyelid: Secondary | ICD-10-CM | POA: Diagnosis not present

## 2018-02-09 DIAGNOSIS — E113293 Type 2 diabetes mellitus with mild nonproliferative diabetic retinopathy without macular edema, bilateral: Secondary | ICD-10-CM | POA: Diagnosis not present

## 2018-03-14 DIAGNOSIS — I509 Heart failure, unspecified: Secondary | ICD-10-CM | POA: Diagnosis not present

## 2018-03-14 DIAGNOSIS — E1021 Type 1 diabetes mellitus with diabetic nephropathy: Secondary | ICD-10-CM | POA: Diagnosis not present

## 2018-03-14 DIAGNOSIS — I739 Peripheral vascular disease, unspecified: Secondary | ICD-10-CM | POA: Diagnosis not present

## 2018-03-14 DIAGNOSIS — N183 Chronic kidney disease, stage 3 (moderate): Secondary | ICD-10-CM | POA: Diagnosis not present

## 2018-03-14 DIAGNOSIS — I480 Paroxysmal atrial fibrillation: Secondary | ICD-10-CM | POA: Diagnosis not present

## 2018-03-14 DIAGNOSIS — I5033 Acute on chronic diastolic (congestive) heart failure: Secondary | ICD-10-CM | POA: Diagnosis not present

## 2018-03-14 DIAGNOSIS — E7849 Other hyperlipidemia: Secondary | ICD-10-CM | POA: Diagnosis not present

## 2018-03-14 DIAGNOSIS — Z794 Long term (current) use of insulin: Secondary | ICD-10-CM | POA: Diagnosis not present

## 2018-03-14 DIAGNOSIS — R69 Illness, unspecified: Secondary | ICD-10-CM | POA: Diagnosis not present

## 2018-03-14 DIAGNOSIS — I10 Essential (primary) hypertension: Secondary | ICD-10-CM | POA: Diagnosis not present

## 2018-03-21 IMAGING — DX DG CHEST 2V
2 series · 2 of 2 positions shown · non-contrast
Comparison: 06/08/2017

CLINICAL DATA: Shortness of breath.

EXAM:
CHEST  2 VIEW

[w chest pa]
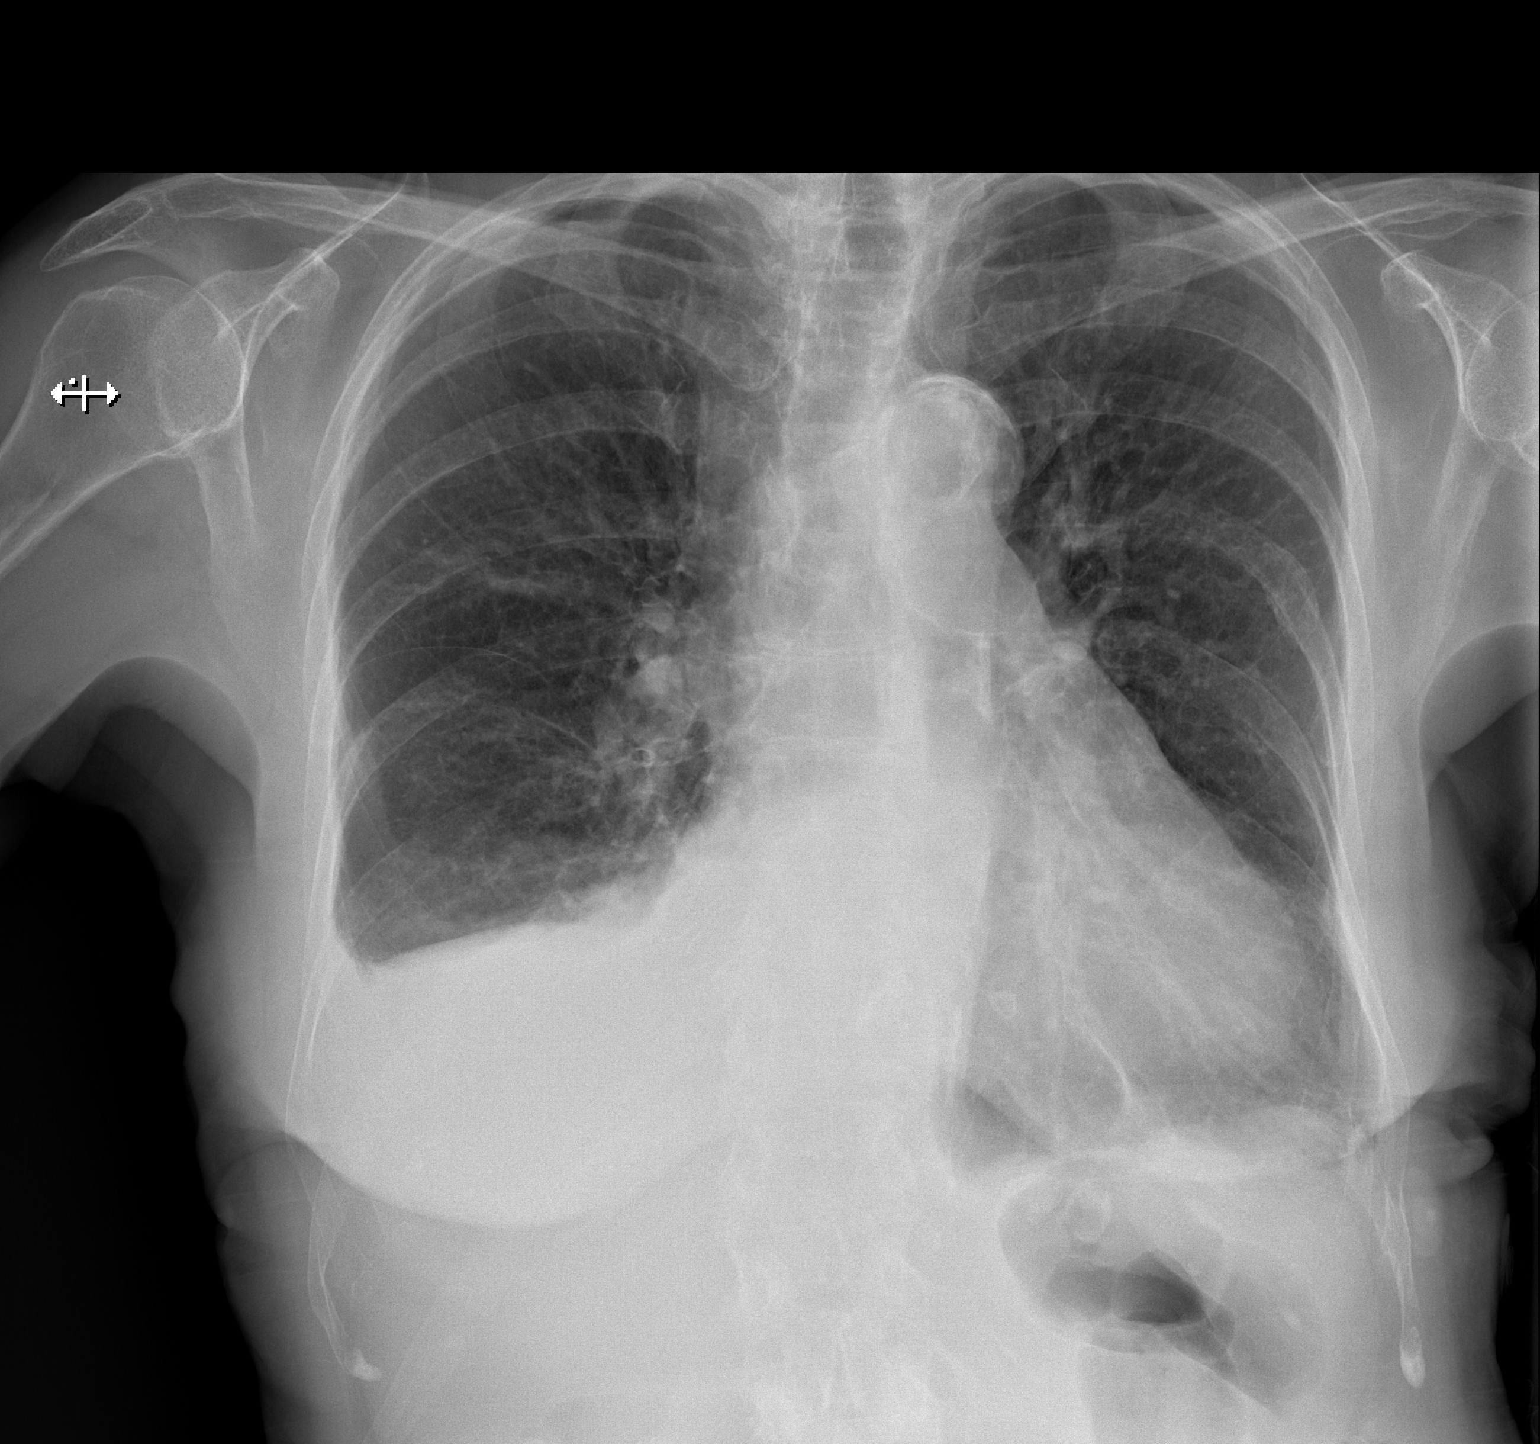

[w chest lat]
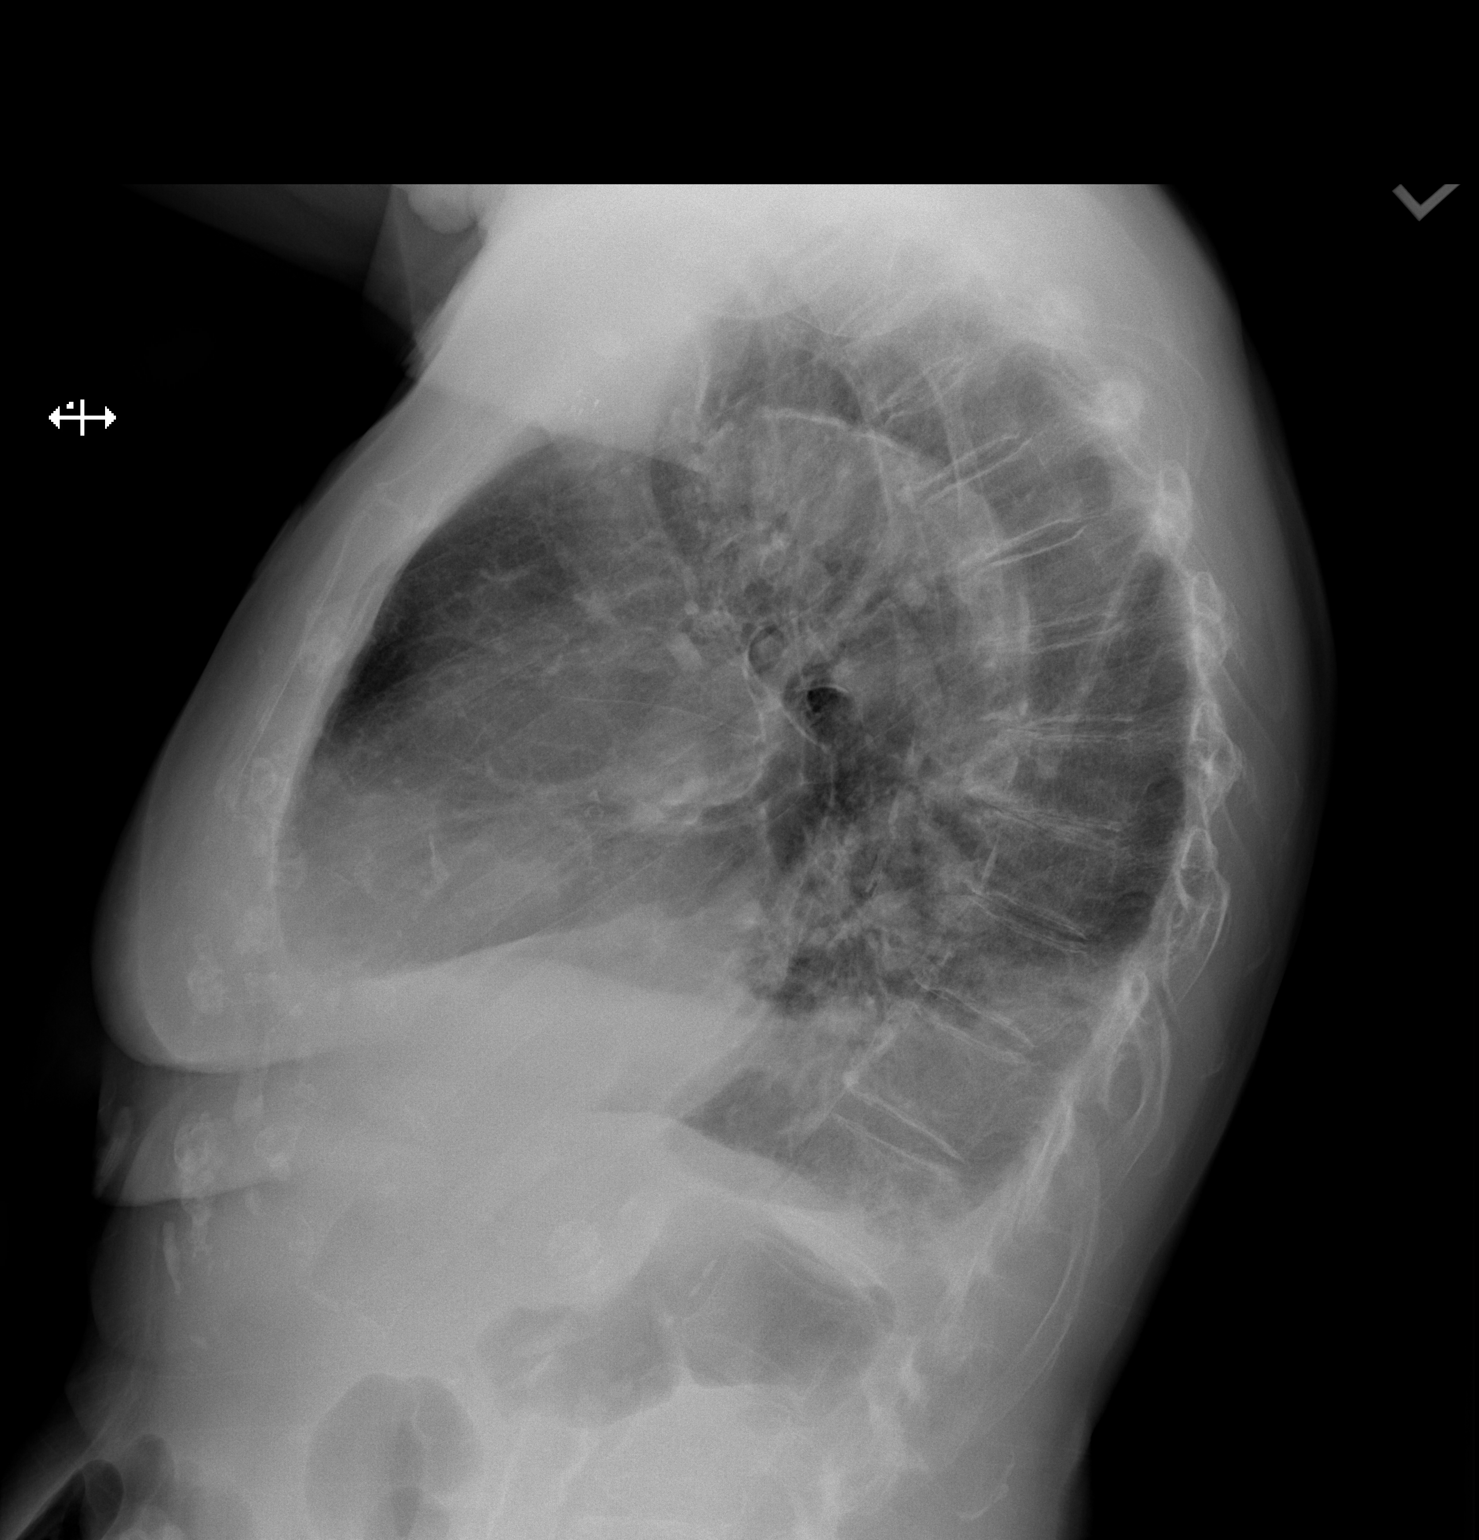

[2 of 2 positions shown; findings below may reference images not displayed]

FINDINGS: The cardiac silhouette remains enlarged. Aortic atherosclerosis is
noted. A small right pleural effusion is similar to the prior study,
while the left-sided pleural effusion has decreased with only trace
fluid remaining. There is compressive atelectasis in the right lung
base. Left lower lobe aeration has improved. Suspected interstitial
edema on the prior study has resolved. There is no evidence of new
airspace consolidation or pneumothorax. No acute osseous abnormality
is seen.
IMPRESSION: 1. Persistent small right pleural effusion with associated
atelectasis.
2. Near complete resolution of left pleural effusion with improved
left basilar aeration.
3. Resolved edema.

## 2018-03-23 IMAGING — CR DG CHEST 2V
2 series · 2 of 2 positions shown · non-contrast
Comparison: 07/14/2017, 06/08/2017

CLINICAL DATA: Chest pain with shortness of breath

EXAM:
CHEST  2 VIEW

[chest pa]
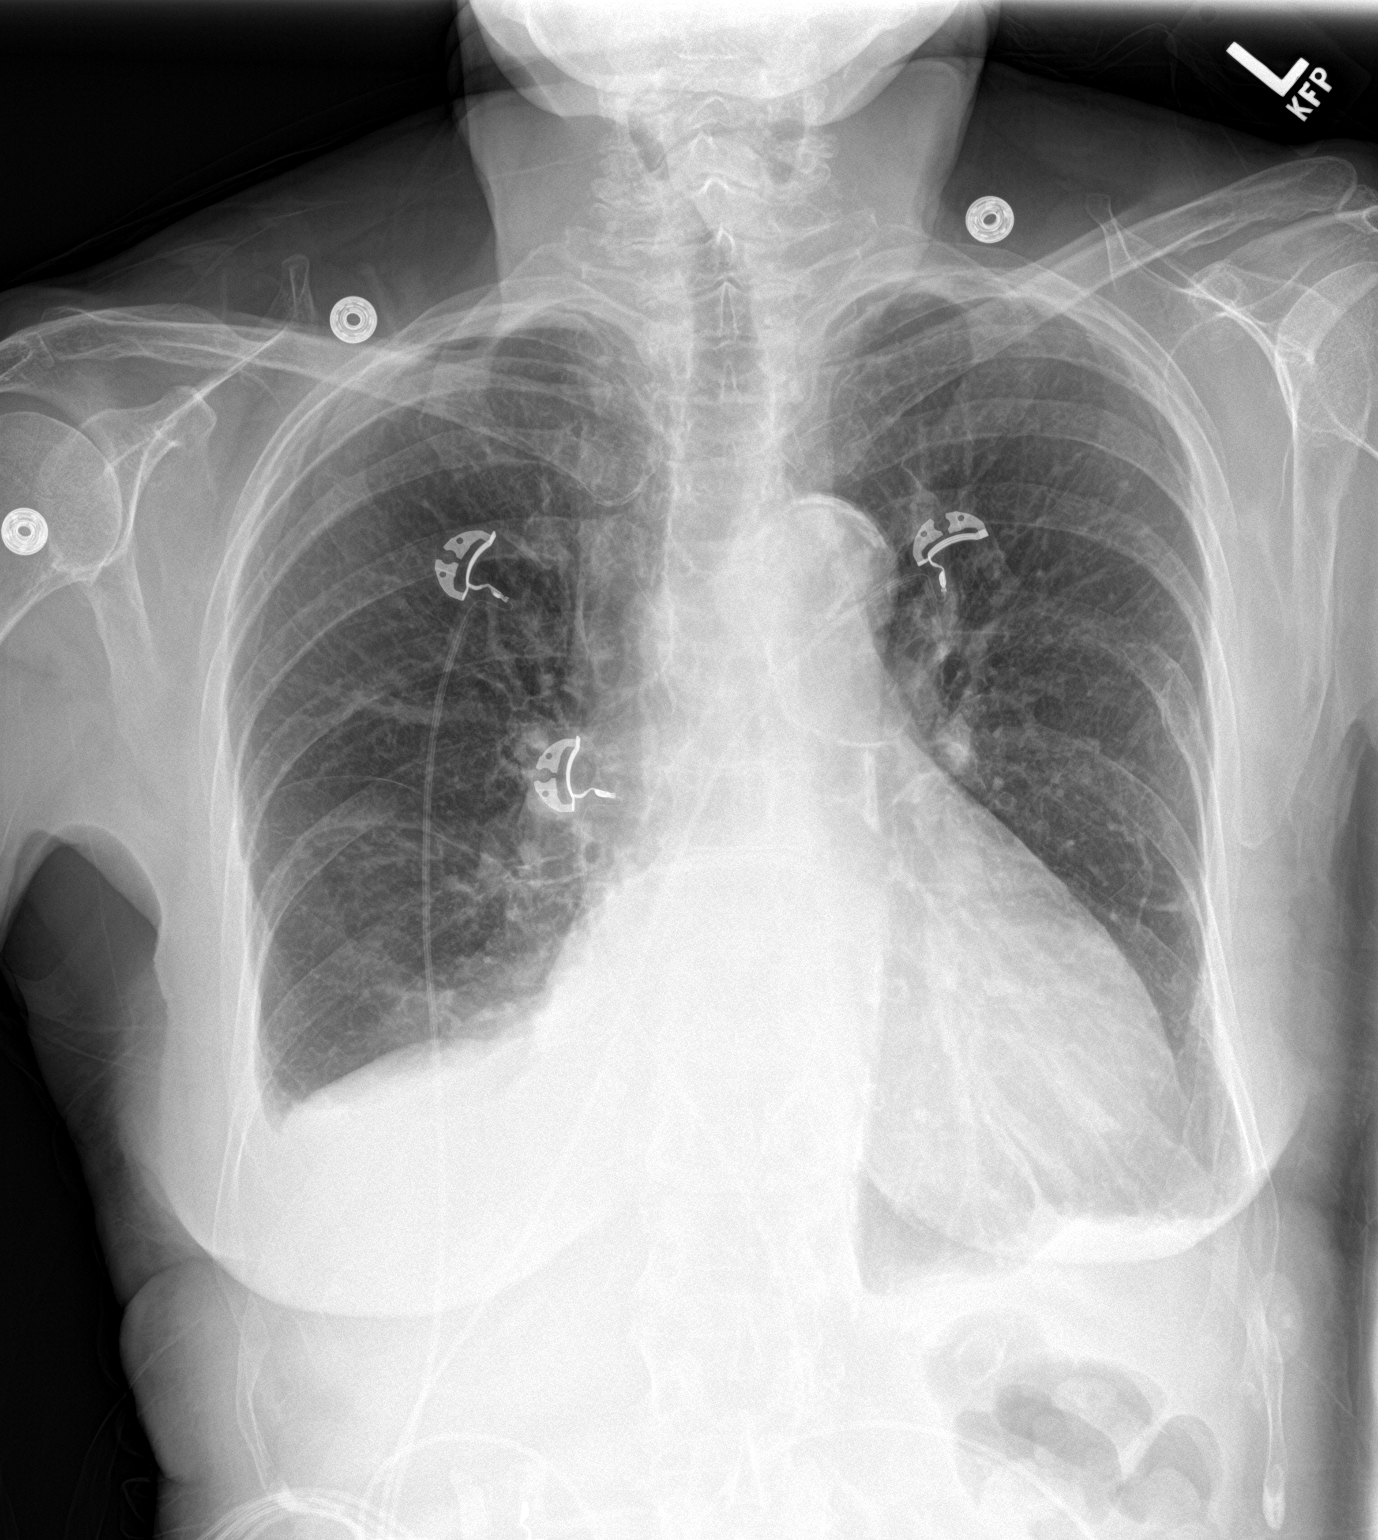

[chest lat]
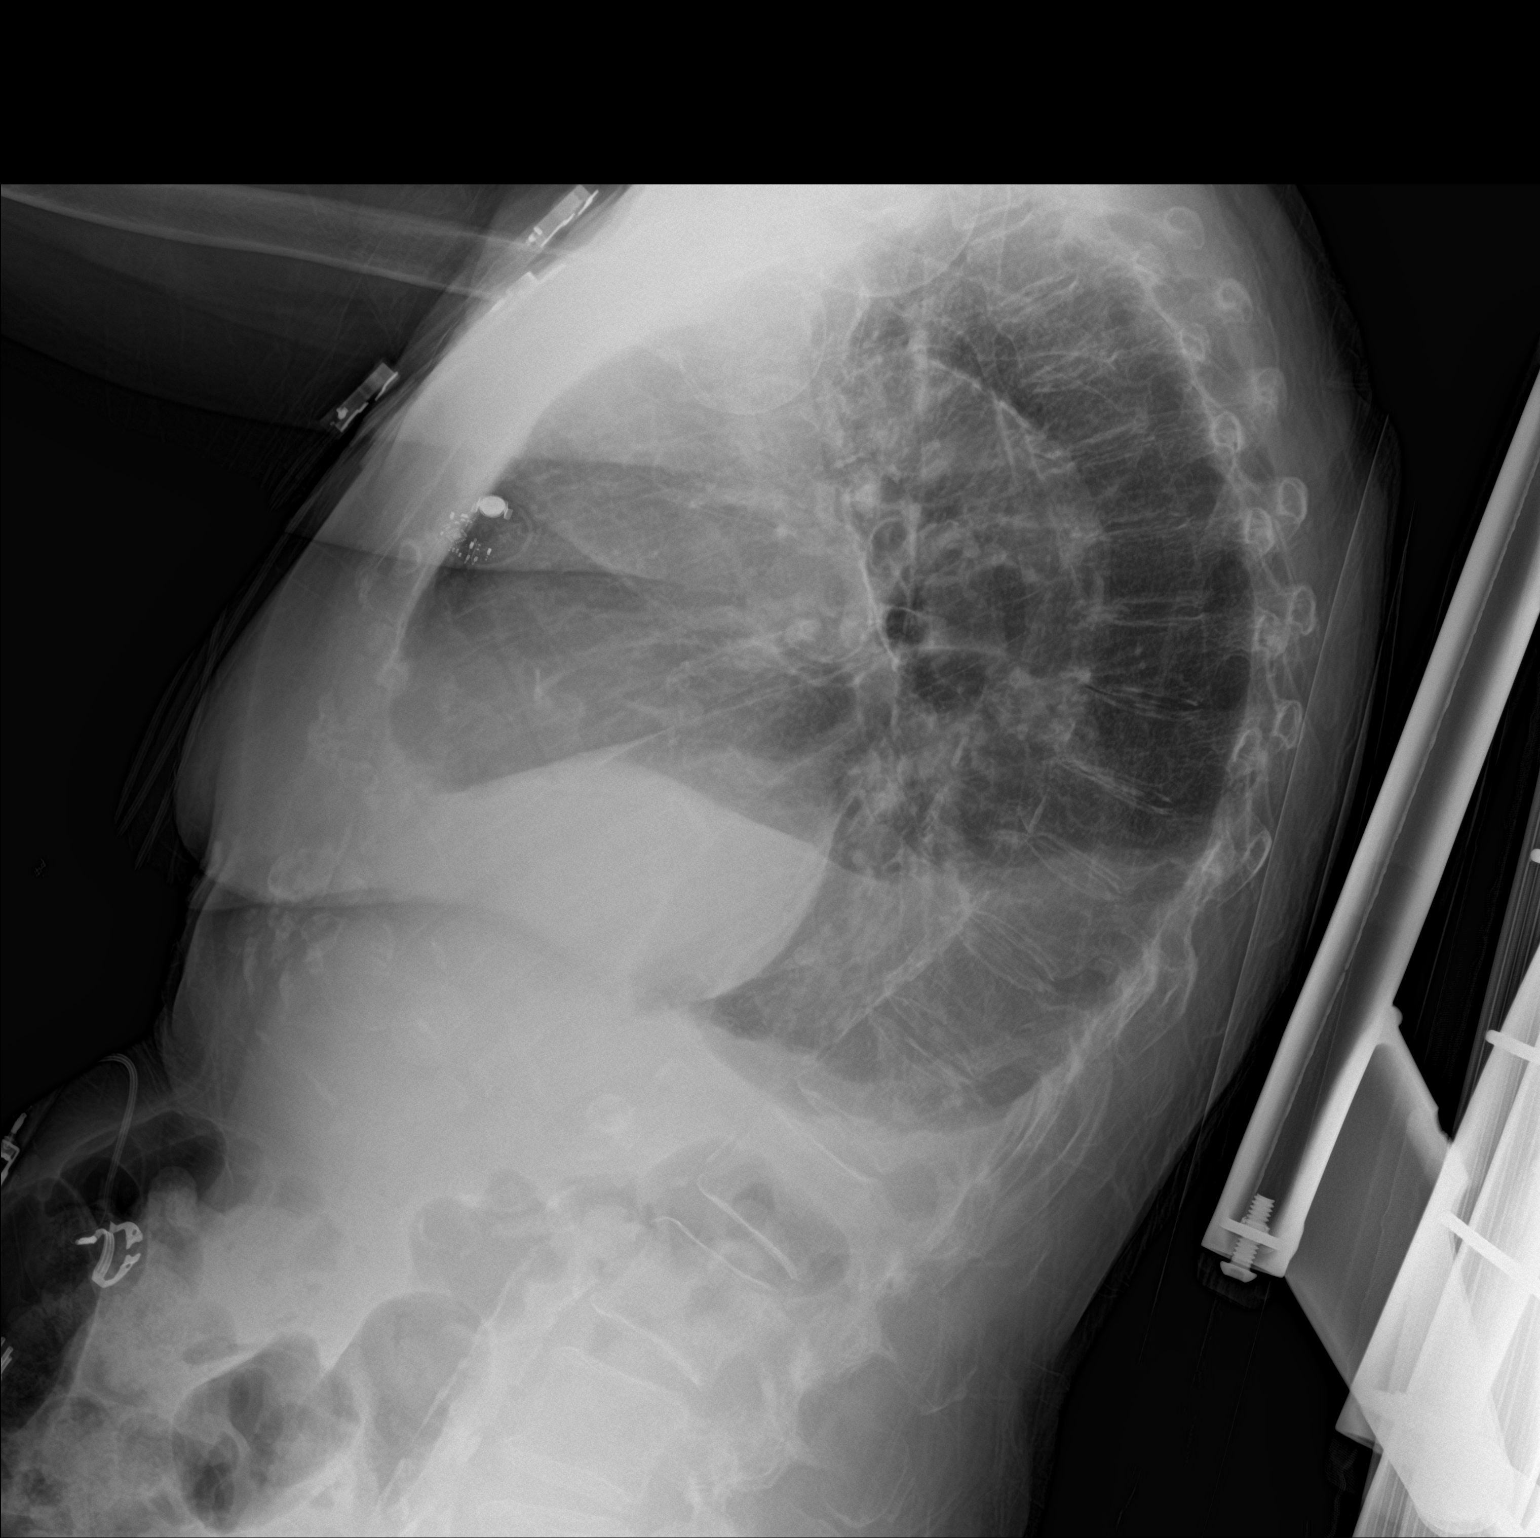

[2 of 2 positions shown; findings below may reference images not displayed]

FINDINGS: Small right greater than left pleural effusions. Cardiomegaly with
aortic atherosclerosis. Streaky atelectasis at the left base.
Airspace disease at the right lung base. No pneumothorax. Old
left-sided rib fracture.
IMPRESSION: 1. Decreased left pleural effusion with small residual
2. Continued small moderate right pleural effusion with right
basilar atelectasis or infiltrate
3. Cardiomegaly

## 2018-03-31 DIAGNOSIS — I482 Chronic atrial fibrillation, unspecified: Secondary | ICD-10-CM | POA: Diagnosis not present

## 2018-03-31 DIAGNOSIS — B9620 Unspecified Escherichia coli [E. coli] as the cause of diseases classified elsewhere: Secondary | ICD-10-CM | POA: Diagnosis not present

## 2018-03-31 DIAGNOSIS — I5030 Unspecified diastolic (congestive) heart failure: Secondary | ICD-10-CM | POA: Diagnosis not present

## 2018-03-31 DIAGNOSIS — E878 Other disorders of electrolyte and fluid balance, not elsewhere classified: Secondary | ICD-10-CM | POA: Diagnosis not present

## 2018-03-31 DIAGNOSIS — G9341 Metabolic encephalopathy: Secondary | ICD-10-CM | POA: Diagnosis not present

## 2018-03-31 DIAGNOSIS — R4182 Altered mental status, unspecified: Secondary | ICD-10-CM | POA: Diagnosis not present

## 2018-03-31 DIAGNOSIS — N390 Urinary tract infection, site not specified: Secondary | ICD-10-CM | POA: Diagnosis not present

## 2018-03-31 DIAGNOSIS — B961 Klebsiella pneumoniae [K. pneumoniae] as the cause of diseases classified elsewhere: Secondary | ICD-10-CM | POA: Diagnosis not present

## 2018-03-31 DIAGNOSIS — E860 Dehydration: Secondary | ICD-10-CM | POA: Diagnosis not present

## 2018-03-31 DIAGNOSIS — I110 Hypertensive heart disease with heart failure: Secondary | ICD-10-CM | POA: Diagnosis not present

## 2018-03-31 DIAGNOSIS — E119 Type 2 diabetes mellitus without complications: Secondary | ICD-10-CM | POA: Diagnosis not present

## 2018-03-31 DIAGNOSIS — E871 Hypo-osmolality and hyponatremia: Secondary | ICD-10-CM | POA: Diagnosis not present

## 2018-04-01 DIAGNOSIS — Z794 Long term (current) use of insulin: Secondary | ICD-10-CM | POA: Diagnosis not present

## 2018-04-01 DIAGNOSIS — E871 Hypo-osmolality and hyponatremia: Secondary | ICD-10-CM | POA: Diagnosis not present

## 2018-04-01 DIAGNOSIS — I110 Hypertensive heart disease with heart failure: Secondary | ICD-10-CM | POA: Diagnosis not present

## 2018-04-01 DIAGNOSIS — N390 Urinary tract infection, site not specified: Secondary | ICD-10-CM | POA: Diagnosis not present

## 2018-04-01 DIAGNOSIS — Z7901 Long term (current) use of anticoagulants: Secondary | ICD-10-CM | POA: Diagnosis not present

## 2018-04-01 DIAGNOSIS — E860 Dehydration: Secondary | ICD-10-CM | POA: Diagnosis not present

## 2018-04-01 DIAGNOSIS — I482 Chronic atrial fibrillation, unspecified: Secondary | ICD-10-CM | POA: Diagnosis not present

## 2018-04-01 DIAGNOSIS — E119 Type 2 diabetes mellitus without complications: Secondary | ICD-10-CM | POA: Diagnosis not present

## 2018-04-01 DIAGNOSIS — I5030 Unspecified diastolic (congestive) heart failure: Secondary | ICD-10-CM | POA: Diagnosis not present

## 2018-04-01 DIAGNOSIS — G9341 Metabolic encephalopathy: Secondary | ICD-10-CM | POA: Diagnosis not present

## 2018-04-02 DIAGNOSIS — I110 Hypertensive heart disease with heart failure: Secondary | ICD-10-CM | POA: Diagnosis not present

## 2018-04-02 DIAGNOSIS — I5030 Unspecified diastolic (congestive) heart failure: Secondary | ICD-10-CM | POA: Diagnosis not present

## 2018-04-02 DIAGNOSIS — E119 Type 2 diabetes mellitus without complications: Secondary | ICD-10-CM | POA: Diagnosis not present

## 2018-04-02 DIAGNOSIS — R9431 Abnormal electrocardiogram [ECG] [EKG]: Secondary | ICD-10-CM | POA: Diagnosis not present

## 2018-04-02 DIAGNOSIS — R001 Bradycardia, unspecified: Secondary | ICD-10-CM | POA: Diagnosis not present

## 2018-04-02 DIAGNOSIS — E871 Hypo-osmolality and hyponatremia: Secondary | ICD-10-CM | POA: Diagnosis not present

## 2018-04-02 DIAGNOSIS — Z7901 Long term (current) use of anticoagulants: Secondary | ICD-10-CM | POA: Diagnosis not present

## 2018-04-02 DIAGNOSIS — I361 Nonrheumatic tricuspid (valve) insufficiency: Secondary | ICD-10-CM | POA: Diagnosis not present

## 2018-04-02 DIAGNOSIS — I10 Essential (primary) hypertension: Secondary | ICD-10-CM | POA: Diagnosis not present

## 2018-04-02 DIAGNOSIS — N390 Urinary tract infection, site not specified: Secondary | ICD-10-CM | POA: Diagnosis not present

## 2018-04-02 DIAGNOSIS — E860 Dehydration: Secondary | ICD-10-CM | POA: Diagnosis not present

## 2018-04-02 DIAGNOSIS — G9341 Metabolic encephalopathy: Secondary | ICD-10-CM | POA: Diagnosis not present

## 2018-04-02 DIAGNOSIS — I482 Chronic atrial fibrillation, unspecified: Secondary | ICD-10-CM | POA: Diagnosis not present

## 2018-04-03 DIAGNOSIS — N390 Urinary tract infection, site not specified: Secondary | ICD-10-CM | POA: Diagnosis not present

## 2018-04-03 DIAGNOSIS — Z7901 Long term (current) use of anticoagulants: Secondary | ICD-10-CM | POA: Diagnosis not present

## 2018-04-03 DIAGNOSIS — I5030 Unspecified diastolic (congestive) heart failure: Secondary | ICD-10-CM | POA: Diagnosis not present

## 2018-04-03 DIAGNOSIS — I110 Hypertensive heart disease with heart failure: Secondary | ICD-10-CM | POA: Diagnosis not present

## 2018-04-03 DIAGNOSIS — E860 Dehydration: Secondary | ICD-10-CM | POA: Diagnosis not present

## 2018-04-03 DIAGNOSIS — I10 Essential (primary) hypertension: Secondary | ICD-10-CM | POA: Diagnosis not present

## 2018-04-03 DIAGNOSIS — I482 Chronic atrial fibrillation, unspecified: Secondary | ICD-10-CM | POA: Diagnosis not present

## 2018-04-03 DIAGNOSIS — E119 Type 2 diabetes mellitus without complications: Secondary | ICD-10-CM | POA: Diagnosis not present

## 2018-04-03 DIAGNOSIS — E871 Hypo-osmolality and hyponatremia: Secondary | ICD-10-CM | POA: Diagnosis not present

## 2018-04-03 DIAGNOSIS — I4891 Unspecified atrial fibrillation: Secondary | ICD-10-CM | POA: Diagnosis not present

## 2018-04-03 DIAGNOSIS — I472 Ventricular tachycardia: Secondary | ICD-10-CM | POA: Diagnosis not present

## 2018-04-03 DIAGNOSIS — G9341 Metabolic encephalopathy: Secondary | ICD-10-CM | POA: Diagnosis not present

## 2018-04-03 DIAGNOSIS — R001 Bradycardia, unspecified: Secondary | ICD-10-CM | POA: Diagnosis not present

## 2018-04-14 DIAGNOSIS — R001 Bradycardia, unspecified: Secondary | ICD-10-CM | POA: Diagnosis not present

## 2018-04-14 DIAGNOSIS — N390 Urinary tract infection, site not specified: Secondary | ICD-10-CM | POA: Diagnosis not present

## 2018-04-14 DIAGNOSIS — Z6820 Body mass index (BMI) 20.0-20.9, adult: Secondary | ICD-10-CM | POA: Diagnosis not present

## 2018-04-14 DIAGNOSIS — G9341 Metabolic encephalopathy: Secondary | ICD-10-CM | POA: Diagnosis not present

## 2018-04-14 DIAGNOSIS — E1151 Type 2 diabetes mellitus with diabetic peripheral angiopathy without gangrene: Secondary | ICD-10-CM | POA: Diagnosis not present

## 2018-04-14 DIAGNOSIS — I10 Essential (primary) hypertension: Secondary | ICD-10-CM | POA: Diagnosis not present

## 2018-04-14 DIAGNOSIS — E871 Hypo-osmolality and hyponatremia: Secondary | ICD-10-CM | POA: Diagnosis not present

## 2018-04-14 DIAGNOSIS — I5033 Acute on chronic diastolic (congestive) heart failure: Secondary | ICD-10-CM | POA: Diagnosis not present

## 2018-04-14 DIAGNOSIS — I480 Paroxysmal atrial fibrillation: Secondary | ICD-10-CM | POA: Diagnosis not present

## 2018-04-17 DIAGNOSIS — I4891 Unspecified atrial fibrillation: Secondary | ICD-10-CM | POA: Diagnosis not present

## 2018-04-17 DIAGNOSIS — I472 Ventricular tachycardia: Secondary | ICD-10-CM | POA: Diagnosis not present

## 2018-04-17 DIAGNOSIS — R001 Bradycardia, unspecified: Secondary | ICD-10-CM | POA: Diagnosis not present

## 2018-04-23 DIAGNOSIS — R001 Bradycardia, unspecified: Secondary | ICD-10-CM | POA: Diagnosis not present

## 2018-04-23 DIAGNOSIS — I4891 Unspecified atrial fibrillation: Secondary | ICD-10-CM | POA: Diagnosis not present

## 2018-04-23 DIAGNOSIS — I472 Ventricular tachycardia: Secondary | ICD-10-CM | POA: Diagnosis not present

## 2018-05-01 DIAGNOSIS — E1151 Type 2 diabetes mellitus with diabetic peripheral angiopathy without gangrene: Secondary | ICD-10-CM | POA: Diagnosis not present

## 2018-05-01 DIAGNOSIS — M79674 Pain in right toe(s): Secondary | ICD-10-CM | POA: Diagnosis not present

## 2018-05-01 DIAGNOSIS — M79675 Pain in left toe(s): Secondary | ICD-10-CM | POA: Diagnosis not present

## 2018-05-01 DIAGNOSIS — B351 Tinea unguium: Secondary | ICD-10-CM | POA: Diagnosis not present

## 2018-05-01 DIAGNOSIS — I739 Peripheral vascular disease, unspecified: Secondary | ICD-10-CM | POA: Diagnosis not present

## 2018-05-03 DIAGNOSIS — Z6820 Body mass index (BMI) 20.0-20.9, adult: Secondary | ICD-10-CM | POA: Diagnosis not present

## 2018-05-03 DIAGNOSIS — E1151 Type 2 diabetes mellitus with diabetic peripheral angiopathy without gangrene: Secondary | ICD-10-CM | POA: Diagnosis not present

## 2018-05-03 DIAGNOSIS — I10 Essential (primary) hypertension: Secondary | ICD-10-CM | POA: Diagnosis not present

## 2018-05-03 DIAGNOSIS — Z794 Long term (current) use of insulin: Secondary | ICD-10-CM | POA: Diagnosis not present

## 2018-05-17 DIAGNOSIS — I4821 Permanent atrial fibrillation: Secondary | ICD-10-CM | POA: Diagnosis not present

## 2018-05-17 DIAGNOSIS — I10 Essential (primary) hypertension: Secondary | ICD-10-CM | POA: Diagnosis not present

## 2018-05-17 DIAGNOSIS — Z0189 Encounter for other specified special examinations: Secondary | ICD-10-CM | POA: Diagnosis not present

## 2018-05-17 DIAGNOSIS — I5032 Chronic diastolic (congestive) heart failure: Secondary | ICD-10-CM | POA: Diagnosis not present

## 2018-05-29 DIAGNOSIS — L089 Local infection of the skin and subcutaneous tissue, unspecified: Secondary | ICD-10-CM | POA: Diagnosis not present

## 2018-05-29 DIAGNOSIS — N390 Urinary tract infection, site not specified: Secondary | ICD-10-CM | POA: Diagnosis not present

## 2018-06-08 DIAGNOSIS — L02425 Furuncle of right lower limb: Secondary | ICD-10-CM | POA: Diagnosis not present

## 2018-06-08 DIAGNOSIS — B9689 Other specified bacterial agents as the cause of diseases classified elsewhere: Secondary | ICD-10-CM | POA: Diagnosis not present

## 2018-06-29 DIAGNOSIS — B9689 Other specified bacterial agents as the cause of diseases classified elsewhere: Secondary | ICD-10-CM | POA: Diagnosis not present

## 2018-06-29 DIAGNOSIS — L02425 Furuncle of right lower limb: Secondary | ICD-10-CM | POA: Diagnosis not present

## 2018-07-10 DIAGNOSIS — I739 Peripheral vascular disease, unspecified: Secondary | ICD-10-CM | POA: Diagnosis not present

## 2018-07-10 DIAGNOSIS — B351 Tinea unguium: Secondary | ICD-10-CM | POA: Diagnosis not present

## 2018-07-10 DIAGNOSIS — M79675 Pain in left toe(s): Secondary | ICD-10-CM | POA: Diagnosis not present

## 2018-07-10 DIAGNOSIS — E1151 Type 2 diabetes mellitus with diabetic peripheral angiopathy without gangrene: Secondary | ICD-10-CM | POA: Diagnosis not present

## 2018-07-10 DIAGNOSIS — M79674 Pain in right toe(s): Secondary | ICD-10-CM | POA: Diagnosis not present

## 2018-07-19 DIAGNOSIS — E1151 Type 2 diabetes mellitus with diabetic peripheral angiopathy without gangrene: Secondary | ICD-10-CM | POA: Diagnosis not present

## 2018-07-19 DIAGNOSIS — R69 Illness, unspecified: Secondary | ICD-10-CM | POA: Diagnosis not present

## 2018-07-19 DIAGNOSIS — I480 Paroxysmal atrial fibrillation: Secondary | ICD-10-CM | POA: Diagnosis not present

## 2018-07-19 DIAGNOSIS — E871 Hypo-osmolality and hyponatremia: Secondary | ICD-10-CM | POA: Diagnosis not present

## 2018-07-19 DIAGNOSIS — I10 Essential (primary) hypertension: Secondary | ICD-10-CM | POA: Diagnosis not present

## 2018-07-19 DIAGNOSIS — N183 Chronic kidney disease, stage 3 (moderate): Secondary | ICD-10-CM | POA: Diagnosis not present

## 2018-07-19 DIAGNOSIS — E1142 Type 2 diabetes mellitus with diabetic polyneuropathy: Secondary | ICD-10-CM | POA: Diagnosis not present

## 2018-07-19 DIAGNOSIS — I509 Heart failure, unspecified: Secondary | ICD-10-CM | POA: Diagnosis not present

## 2018-07-19 DIAGNOSIS — Z794 Long term (current) use of insulin: Secondary | ICD-10-CM | POA: Diagnosis not present

## 2018-07-19 DIAGNOSIS — I739 Peripheral vascular disease, unspecified: Secondary | ICD-10-CM | POA: Diagnosis not present

## 2018-08-30 DIAGNOSIS — N183 Chronic kidney disease, stage 3 (moderate): Secondary | ICD-10-CM | POA: Diagnosis not present

## 2018-08-30 DIAGNOSIS — I10 Essential (primary) hypertension: Secondary | ICD-10-CM | POA: Diagnosis not present

## 2018-08-30 DIAGNOSIS — Z794 Long term (current) use of insulin: Secondary | ICD-10-CM | POA: Diagnosis not present

## 2018-08-30 DIAGNOSIS — Z6820 Body mass index (BMI) 20.0-20.9, adult: Secondary | ICD-10-CM | POA: Diagnosis not present

## 2018-08-30 DIAGNOSIS — E1151 Type 2 diabetes mellitus with diabetic peripheral angiopathy without gangrene: Secondary | ICD-10-CM | POA: Diagnosis not present

## 2018-11-17 ENCOUNTER — Ambulatory Visit: Payer: Self-pay | Admitting: Cardiology

## 2018-11-29 ENCOUNTER — Ambulatory Visit: Payer: Medicare HMO | Admitting: Cardiology

## 2018-12-08 DIAGNOSIS — E039 Hypothyroidism, unspecified: Secondary | ICD-10-CM | POA: Diagnosis not present

## 2018-12-08 DIAGNOSIS — E1151 Type 2 diabetes mellitus with diabetic peripheral angiopathy without gangrene: Secondary | ICD-10-CM | POA: Diagnosis not present

## 2018-12-08 DIAGNOSIS — E871 Hypo-osmolality and hyponatremia: Secondary | ICD-10-CM | POA: Diagnosis not present

## 2018-12-08 DIAGNOSIS — I739 Peripheral vascular disease, unspecified: Secondary | ICD-10-CM | POA: Diagnosis not present

## 2018-12-08 DIAGNOSIS — I10 Essential (primary) hypertension: Secondary | ICD-10-CM | POA: Diagnosis not present

## 2018-12-08 DIAGNOSIS — E1142 Type 2 diabetes mellitus with diabetic polyneuropathy: Secondary | ICD-10-CM | POA: Diagnosis not present

## 2018-12-08 DIAGNOSIS — I480 Paroxysmal atrial fibrillation: Secondary | ICD-10-CM | POA: Diagnosis not present

## 2018-12-08 DIAGNOSIS — E785 Hyperlipidemia, unspecified: Secondary | ICD-10-CM | POA: Diagnosis not present

## 2018-12-08 DIAGNOSIS — N183 Chronic kidney disease, stage 3 (moderate): Secondary | ICD-10-CM | POA: Diagnosis not present

## 2018-12-08 DIAGNOSIS — R269 Unspecified abnormalities of gait and mobility: Secondary | ICD-10-CM | POA: Diagnosis not present

## 2018-12-19 ENCOUNTER — Ambulatory Visit (INDEPENDENT_AMBULATORY_CARE_PROVIDER_SITE_OTHER): Payer: Medicare HMO | Admitting: Cardiology

## 2018-12-19 ENCOUNTER — Other Ambulatory Visit: Payer: Self-pay

## 2018-12-19 ENCOUNTER — Encounter: Payer: Self-pay | Admitting: Cardiology

## 2018-12-19 VITALS — BP 140/66 | HR 62 | Ht 60.0 in | Wt 114.6 lb

## 2018-12-19 DIAGNOSIS — I4821 Permanent atrial fibrillation: Secondary | ICD-10-CM

## 2018-12-19 DIAGNOSIS — I10 Essential (primary) hypertension: Secondary | ICD-10-CM

## 2018-12-19 DIAGNOSIS — I5032 Chronic diastolic (congestive) heart failure: Secondary | ICD-10-CM

## 2018-12-19 NOTE — Progress Notes (Signed)
Primary Physician/Referring:  Reynold Bowen, MD  Patient ID: Judy Kent, female    DOB: 03/08/1927, 85 y.o.   MRN: [id number]  Chief Complaint  Patient presents with  . Atrial Fibrillation    6 month f/u     HPI: Judy Kent  is a 85 y.o. female   Caucasian female with h/o diastolic heart failure, has had 2 admissionions with heart failure since December 2018. Echocardiogram in Dec 2018 at that time showed LVEF of 29-02% and diastolic dysfunction. Patient and her daughter have been extremely careful with salt restriction and also weighing herself on a daily basis. Patient has a past medical history of chronic atrial fibrillation, hypertension, type 2 diabetes, and hypothyroidism.  She now presents for a six-month office visit and follow-up, states that she is presently doing well, "somebody to leave" at any time.  Denies worsening dyspnea or chest pain or palpitations, no recent fall, presently tolerating Eliquis without bleeding complications.  Past Medical History:  Diagnosis Date  . A-fib ([place])   . Anemia   . Chronic diastolic CHF (congestive heart failure) ([place])   . Dementia ([place])   . Diabetic peripheral neuropathy ([place])    "not much feeling from feet up to my waist" (07/15/2017)  . History of blood transfusion ~ 2014   "low HgB" (07/15/2017)  . HTN (hypertension)   . Hypothyroidism   . PAF (paroxysmal atrial fibrillation) ([place])   . PONV (postoperative nausea and vomiting)   . TIA (transient ischemic attack) ~ 2014; 2015   "after receiving blood; just happened, not hospitalized" (07/15/2017)  . Type II diabetes mellitus ([place])    "was told I'm type 1 now cause Insulin production has stopped" (07/15/2017)    Past Surgical History:  Procedure Laterality Date  . CATARACT EXTRACTION, BILATERAL Bilateral   . FRACTURE SURGERY    . TIBIA FRACTURE SURGERY Left    "I have 9 screws in there"  . TONSILLECTOMY      Social History   Socioeconomic History  . Marital  status: Widowed    Spouse name: Not on file  . Number of children: 5  . Years of education: Not on file  . 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  Social Needs  . Financial resource strain: Not on file  . Food insecurity    Worry: Not on file    Inability: Not on file  . Transportation needs    Medical: Not on file    Non-medical: Not on file  Tobacco Use  . Smoking status: Never Smoker  . Smokeless tobacco: Never Used  Substance and Sexual Activity  . Alcohol use: No    Frequency: Never  . Drug use: No  . Sexual activity: Not on file  Lifestyle  . Physical activity    Days per week: Not on file    Minutes per session: Not on file  . Stress: Not on file  Relationships  . Social Herbalist on phone: Not on file    Gets together: Not on file    Attends religious service: Not on file    Active member of club or organization: Not on file    Attends meetings of clubs or organizations: Not on file    Relationship status: Not on file  . Intimate partner violence    Fear of current or ex partner: Not on file    Emotionally abused: Not on file  Physically abused: Not on file    Forced sexual activity: Not on file  Other Topics Concern  . Not on file  Social History Narrative   From [place] with daughter   Children- 5   Education- high school   Review of Systems  Constitution: Negative for chills, decreased appetite, malaise/fatigue and weight gain.  Cardiovascular: Positive for dyspnea on exertion (chronic and stable) and leg swelling. Negative for syncope.  Endocrine: Negative for cold intolerance.  Hematologic/Lymphatic: Does not bruise/bleed easily.  Musculoskeletal: Positive for arthritis. Negative for joint swelling.  Gastrointestinal: Negative for abdominal pain, anorexia, change in bowel habit, hematochezia and melena.  Neurological: Negative for headaches and light-headedness.   Psychiatric/Behavioral: Negative for depression and substance abuse.  All other systems reviewed and are negative.   Objective  Blood pressure 140/66, pulse 62, height 5' (1.524 m), weight 114 lb 9.6 oz (52 kg). Body mass index is 22.38 kg/m.    Physical Exam  Constitutional: She appears well-developed. No distress.  Petite  HENT:  Head: Atraumatic.  Eyes: Conjunctivae are normal.  Neck: Neck supple. No JVD present. No thyromegaly present.  Cardiovascular: Intact distal pulses. An irregularly irregular rhythm present. Exam reveals no gallop, no S3 and no S4.  Murmur heard.  Scratchy early systolic murmur is present with a grade of 2/6 at the upper right sternal border. Pulses:      Dorsalis pedis pulses are 1+ on the right side and 1+ on the left side.       Posterior tibial pulses are 1+ on the right side and 1+ on the left side.  S1 is variable, S2 is normal. No JVD Trace bilateral leg edema.    Pulmonary/Chest: Effort normal and breath sounds normal.  Mildly tachypneic  Abdominal: Soft. Bowel sounds are normal.  Musculoskeletal: Normal range of motion.  Neurological: She is alert.  Skin: Skin is warm and dry.  Psychiatric: She has a normal mood and affect.   Radiology: No results found.  Laboratory examination:   Labs 04/02/2018: Serum creatinine 1.27, potassium 4.2, eGFR greater than 60 mL. CMP otherwise normal. A1c 7.8%. Glucose 424, triglycerides 89, HDL 60, LDL 134. TSH normal. Vitamin D 72.4. HB 13.9/HCT 39.6, platelets 147. CMP Latest Ref Rng & Units 07/16/2017 07/15/2017 07/14/2017  Glucose 65 - 99 mg/dL 118(H) 164(H) 115(H)  BUN 6 - 20 mg/dL 32(H) 29(H) 39(H)  Creatinine 0.44 - 1.00 mg/dL 1.12(H) 0.96 1.06(H)  Sodium 135 - 145 mmol/L 135 136 136  Potassium 3.5 - 5.1 mmol/L 4.0 3.3(L) 4.0  Chloride 101 - 111 mmol/L 98(L) 98(L) 98(L)  CO2 22 - 32 mmol/L _0 Calcium 8.9 - 10.3 mg/dL 9.0 8.9 9.1   CBC Latest Ref Rng & Units 07/14/2017 06/08/2017  WBC 4.0 -  10.5 K/uL 6.9 5.8  Hemoglobin 12.0 - 15.0 g/dL 12.4 12.3  Hematocrit 36.0 - 46.0 % 37.3 37.0  Platelets 150 - 400 K/uL 212 217   Lipid Panel  No results found for: CHOL, TRIG, HDL, CHOLHDL, VLDL, LDLCALC, LDLDIRECT HEMOGLOBIN A1C Lab Results  Component Value Date   HGBA1C 7.9 (H) 06/08/2017   MPG 180.03 06/08/2017   TSH No results for input(s): TSH in the last 8760 hours.   Medications   Current Outpatient Medications  Medication Instructions  . amLODipine (NORVASC) 10 mg, Oral, Daily  . apixaban (ELIQUIS) 2.5 mg, Oral, 2 times daily  . b complex vitamins tablet 1 tablet, Oral, Daily  . FeAspGl-FeFum-B12-FA-C-Succ Ac [facility]  28) MISC 28 mg, Oral, Every 48 hours  . insulin degludec (TRESIBA FLEXTOUCH) 20 Units, Subcutaneous, Daily  . insulin lispro (HUMALOG) 2-4 Units, Subcutaneous, See admin instructions, 1 unit per 15 carbs per sliding scale,adding  1 unit if BS is 150-200.  Marland Kitchen levothyroxine (SYNTHROID) 50 mcg, Oral, Daily before breakfast  . lisinopril (ZESTRIL) 10 mg, Oral, Daily at bedtime  . Lutein 20 MG CAPS Oral  . multivitamin-lutein (OCUVITE-LUTEIN) CAPS capsule 1 capsule, Oral, Daily  . mupirocin ointment (BACTROBAN) 2 % 1 application, Topical, 3 times daily  . Omega 3-6-9 Fatty Acids (OMEGA 3-6-9 COMPLEX PO) 1 capsule, Oral, 2 times daily  . torsemide (DEMADEX) 20 mg, Oral, 2 times daily, Take one in the morning and one in the afternoon.  Nelva Nay Max SoloStar 24 Units, Subcutaneous, Daily  . Vitamin D3 5,000 Units, Oral, Daily    Cardiac Studies:   Echocardiogram 06/09/2017: LVEF 60-65%. No wall motion abnormality. Severe dilation of left atrium. Moderate dilation of right atrium. Moderate tricuspid regurgitation, otherwise no valvular abnormality. PA pressure 47 mmHg.  Assessment   Permanent atrial fibrillation - Plan: Obtain medical records  Essential hypertension  Chronic diastolic (congestive) heart failure (HCC)   EKG 05/17/2018: Atrial fibrillation  with controlled ventricular response at the rate of 64 bpm, normal axis.  Nonspecific T abnormality. No significant change from EKG 11/17/2017.  Recommendations:   Ms. Raybuck is a delightful 85 YO Caucasian female with h/o permanent atrial fibrillation, hypertension, type 2 diabetes, and hypothyroidism. She is on anticoagulation with Eliquis. She has diastolic heart failure, has had 2 admissionions with heart failure since December 2018. Echocardiogram in Dec 2018 at that time showed LVEF of 29-24% and diastolic dysfunction. She is presently doing well, tolerating anticoagulation with Eliquis without pitting diathesis for atrial fibrillation, blood pressure is well controlled and dyspnea stable.  No clinical evidence of congestive heart failure.  Continue present medications, see her back in 6 months.  Adrian Prows, MD, [facility] 12/19/2018, 4:34 PM [facility] [place] Pager: [phone] Office: [phone] If no answer Cell [phone]

## 2019-01-19 ENCOUNTER — Other Ambulatory Visit: Payer: Self-pay

## 2019-01-19 MED ORDER — TORSEMIDE 20 MG PO TABS: 20.0000 mg | ORAL_TABLET | Freq: Two times a day (BID) | ORAL | 1 refills | Status: DC

## 2019-01-19 MED ORDER — APIXABAN 2.5 MG PO TABS: 2.5000 mg | ORAL_TABLET | Freq: Two times a day (BID) | ORAL | 1 refills | Status: DC

## 2019-02-12 ENCOUNTER — Other Ambulatory Visit: Payer: Self-pay | Admitting: Cardiology

## 2019-03-20 ENCOUNTER — Other Ambulatory Visit: Payer: Self-pay | Admitting: Cardiology

## 2019-04-24 ENCOUNTER — Other Ambulatory Visit: Payer: Self-pay | Admitting: Cardiology

## 2019-05-08 ENCOUNTER — Other Ambulatory Visit: Payer: Self-pay | Admitting: Cardiology

## 2019-06-27 ENCOUNTER — Encounter: Payer: Self-pay | Admitting: Cardiology

## 2019-06-27 ENCOUNTER — Ambulatory Visit (INDEPENDENT_AMBULATORY_CARE_PROVIDER_SITE_OTHER): Payer: Medicare HMO | Admitting: Cardiology

## 2019-06-27 ENCOUNTER — Other Ambulatory Visit: Payer: Self-pay

## 2019-06-27 VITALS — BP 138/64 | HR 58 | Temp 97.2°F | Ht 60.0 in | Wt 115.0 lb

## 2019-06-27 DIAGNOSIS — I5032 Chronic diastolic (congestive) heart failure: Secondary | ICD-10-CM

## 2019-06-27 DIAGNOSIS — I4821 Permanent atrial fibrillation: Secondary | ICD-10-CM

## 2019-06-27 DIAGNOSIS — I10 Essential (primary) hypertension: Secondary | ICD-10-CM | POA: Diagnosis not present

## 2019-06-27 NOTE — Progress Notes (Signed)
Primary Physician/Referring:  Reynold Bowen, MD  Patient ID: Judy Kent, female    DOB: [birth year], 86 y.o.   MRN: [id number]  Chief Complaint  Patient presents with  . Atrial Fibrillation    follow up  . Congestive Heart Failure   HPI:    Judy Kent  is a 86 y.o. Caucasian female with h/o diastolic heart failure, chronic atrial fibrillation, hypertension, type 2 diabetes, and hypothyroidism. Patient and her daughter have been extremely careful with salt restriction and also weighing herself on a daily basis. No recent hospital admissions. Echocardiogram in Dec 2018 showed LVEF of 82-99% and diastolic dysfunction.  She now presents for a six-month office visit and follow-up, states that she is presently doing well.  Denies worsening dyspnea or chest pain or palpitations, no recent fall, presently tolerating Eliquis without bleeding complications.    Past Medical History:  Diagnosis Date  . A-fib ([place])   . Anemia   . Chronic diastolic CHF (congestive heart failure) ([place])   . Dementia ([place])   . Diabetic peripheral neuropathy ([place])    "not much feeling from feet up to my waist" (07/15/2017)  . History of blood transfusion ~ 2014   "low HgB" (07/15/2017)  . HTN (hypertension)   . Hypothyroidism   . PAF (paroxysmal atrial fibrillation) ([place])   . PONV (postoperative nausea and vomiting)   . TIA (transient ischemic attack) ~ 2014; 2015   "after receiving blood; just happened, not hospitalized" (07/15/2017)  . Type II diabetes mellitus ([place])    "was told I'm type 1 now cause Insulin production has stopped" (07/15/2017)   Past Surgical History:  Procedure Laterality Date  . CATARACT EXTRACTION, BILATERAL Bilateral   . FRACTURE SURGERY    . TIBIA FRACTURE SURGERY Left    "I have 9 screws in there"  . TONSILLECTOMY     Social History   Tobacco Use  . Smoking status: Never Smoker  . Smokeless tobacco: Never Used  Substance Use Topics  . Alcohol use: No    ROS   Review of Systems  Constitution: Negative for weight gain.  Cardiovascular: Positive for dyspnea on exertion (mild and stable) and leg swelling (occasional). Negative for syncope.  Respiratory: Negative for hemoptysis.   Endocrine: Negative for cold intolerance.  Hematologic/Lymphatic: Does not bruise/bleed easily.  Gastrointestinal: Negative for hematochezia and melena.  Neurological: Negative for headaches and light-headedness.   Objective  Blood pressure 138/64, pulse (!) 58, temperature (!) 97.2 F (36.2 C), height 5' (1.524 m), weight 115 lb (52.2 kg), SpO2 96 %.  Vitals with BMI 06/27/2019 12/19/2018 09/27/2017  Height 5' 0"  5' 0"  5' 0"   Weight 115 lbs 114 lbs 10 oz 118 lbs 10 oz  BMI 22.46 37.16 96.78  Systolic 938 101 751  Diastolic 64 66 66  Pulse 58 62 61     Physical Exam  Constitutional:  Short stature and petite in no acute distress  HENT:  Head: Atraumatic.  Eyes: Conjunctivae are normal.  Neck: No thyromegaly present.  Cardiovascular: Intact distal pulses and normal pulses. An irregularly irregular rhythm present. Exam reveals no gallop, no S3 and no S4.  No murmur heard. S1 is variable, S2 is normal. No JVD, No leg edema.  Pulmonary/Chest: Effort normal and breath sounds normal.  Abdominal: Soft. Bowel sounds are normal.  Skin: Skin is warm and dry.   Laboratory examination:   No results for input(s): NA, K, CL, CO2, GLUCOSE, BUN, CREATININE, CALCIUM, GFRNONAA, GFRAA in the last 8760  hours. CrCl cannot be calculated (Patient's most recent lab result is older than the maximum 21 days allowed.).  CMP Latest Ref Rng & Units 07/16/2017 07/15/2017 07/14/2017  Glucose 65 - 99 mg/dL 118(H) 164(H) 115(H)  BUN 6 - 20 mg/dL 32(H) 29(H) 39(H)  Creatinine 0.44 - 1.00 mg/dL 1.12(H) 0.96 1.06(H)  Sodium 135 - 145 mmol/L 135 136 136  Potassium 3.5 - 5.1 mmol/L 4.0 3.3(L) 4.0  Chloride 101 - 111 mmol/L 98(L) 98(L) 98(L)  CO2 22 - 32 mmol/L 24 26 25   Calcium 8.9 - 10.3 mg/dL  9.0 8.9 9.1   CBC Latest Ref Rng & Units 07/14/2017 06/08/2017  WBC 4.0 - 10.5 K/uL 6.9 5.8  Hemoglobin 12.0 - 15.0 g/dL 12.4 12.3  Hematocrit 36.0 - 46.0 % 37.3 37.0  Platelets 150 - 400 K/uL 212 217   Lipid Panel  No results found for: CHOL, TRIG, HDL, CHOLHDL, VLDL, LDLCALC, LDLDIRECT HEMOGLOBIN A1C Lab Results  Component Value Date   HGBA1C 7.9 (H) 06/08/2017   MPG 180.03 06/08/2017   TSH No results for input(s): TSH in the last 8760 hours.  Labs 04/02/2018: Serum creatinine 1.27, potassium 4.2, eGFR greater than 60 mL. CMP otherwise normal. A1c 7.8%. Glucose 424, triglycerides 89, HDL 60, LDL 134. TSH normal. Vitamin D 72.4. HB 13.9/HCT 39.6, platelets 147.  Medications and allergies   Allergies  Allergen Reactions  . Cantaloupe (Diagnostic) Swelling  . Sulfa Antibiotics Shortness Of Breath     Current Outpatient Medications  Medication Instructions  . amLODipine (NORVASC) 10 mg, Oral, Daily  . apixaban (ELIQUIS) 2.5 mg, Oral, 2 times daily  . b complex vitamins tablet 1 tablet, Oral, Daily  . FeAspGl-FeFum-B12-FA-C-Succ Ac (FERREX 28) MISC 28 mg, Oral, Every 48 hours  . insulin degludec (TRESIBA FLEXTOUCH) 20 Units, Subcutaneous, Daily  . insulin lispro (HUMALOG) 2-4 Units, Subcutaneous, See admin instructions, 1 unit per 15 carbs per sliding scale,adding  1 unit if BS is 150-200.  Marland Kitchen levothyroxine (SYNTHROID) 50 mcg, Oral, Daily before breakfast  . lisinopril (ZESTRIL) 10 mg, Oral, Daily at bedtime  . Lutein 20 MG CAPS Oral  . multivitamin-lutein (OCUVITE-LUTEIN) CAPS capsule 1 capsule, Oral, Daily  . mupirocin ointment (BACTROBAN) 2 % 1 application, Topical, 3 times daily  . Omega 3-6-9 Fatty Acids (OMEGA 3-6-9 COMPLEX PO) 1 capsule, Oral, 2 times daily  . torsemide (DEMADEX) 20 MG tablet TAKE 1 TABLET BY MOUTH 2 TIMES DAILY. ONE IN THE MORNING AND ONE IN THE AFTERNOON  . Toujeo Max SoloStar 24 Units, Subcutaneous, Daily  . Vitamin D3 5,000 Units, Oral, Daily     Radiology:  No results found.  Cardiac Studies:   Echocardiogram 06/09/2017: LVEF 60-65%. No wall motion abnormality. Severe dilation of left atrium. Moderate dilation of right atrium. Moderate tricuspid regurgitation, otherwise no valvular abnormality. PA pressure 47 mmHg.  Assessment     ICD-10-CM   1. Permanent atrial fibrillation (HCC)  I48.21 EKG 12-Lead   CHA2DS2-VASc Score is 5.  Yearly risk of stroke: 6.7% (A, F, HTN, DM)   2. Chronic diastolic (congestive) heart failure (HCC)  I50.32   3. Essential hypertension  I10     EKG 06/27/2019: Atrial fibrillation controlled ventricular response at the rate of 63 bpm, leftward axis, poor R-wave progression, cannot exclude anteroseptal infarct old.  Nonspecific T abnormality.  Single PVC. No significant change from  EKG 05/17/2018.   Recommendations:  No orders of the defined types were placed in this encounter.   Judy Kent is a  delightful 86 y.o. Caucasian female with h/o permanent atrial fibrillation, hypertension, type 2 diabetes, and hypothyroidism. She is on anticoagulation with Eliquis. She has diastolic heart failure, no recent hospital admissions since Jan 2019. Echocardiogram in Dec 2018  showed LVEF of 59-93% and diastolic dysfunction.   She is presently doing well and has not had any acute decompensated heart failure. Discussed diet and signs of CHF again.  Heart rate is well controlled, blood pressure is well controlled, no changes in the medications were done today. Had labs in Oct 2020 and told to be stable blood counts and renal function.  I will see her back in 1 year for CHF and A. Fib management. Will obtain labs from PCP.   Adrian Prows, MD, [facility] 06/28/2019, 10:40 PM [facility] [place] Office: [phone]

## 2019-07-13 ENCOUNTER — Other Ambulatory Visit: Payer: Self-pay | Admitting: Cardiology

## 2019-07-18 ENCOUNTER — Other Ambulatory Visit: Payer: Self-pay | Admitting: Cardiology

## 2019-12-31 ENCOUNTER — Encounter (HOSPITAL_COMMUNITY): Payer: Self-pay

## 2019-12-31 ENCOUNTER — Other Ambulatory Visit: Payer: Self-pay

## 2019-12-31 ENCOUNTER — Observation Stay (HOSPITAL_COMMUNITY)
Admission: EM | Admit: 2019-12-31 | Discharge: 2020-01-01 | Disposition: A | Discharge status: Home / Self Care | Payer: Medicare HMO | Attending: Internal Medicine | Admitting: Internal Medicine

## 2019-12-31 ENCOUNTER — Emergency Department (HOSPITAL_COMMUNITY): Payer: Medicare HMO

## 2019-12-31 DIAGNOSIS — E1165 Type 2 diabetes mellitus with hyperglycemia: Principal | ICD-10-CM | POA: Insufficient documentation

## 2019-12-31 DIAGNOSIS — E875 Hyperkalemia: Secondary | ICD-10-CM

## 2019-12-31 DIAGNOSIS — I110 Hypertensive heart disease with heart failure: Secondary | ICD-10-CM | POA: Insufficient documentation

## 2019-12-31 DIAGNOSIS — E119 Type 2 diabetes mellitus without complications: Secondary | ICD-10-CM

## 2019-12-31 DIAGNOSIS — R739 Hyperglycemia, unspecified: Secondary | ICD-10-CM | POA: Diagnosis not present

## 2019-12-31 DIAGNOSIS — Z794 Long term (current) use of insulin: Secondary | ICD-10-CM | POA: Insufficient documentation

## 2019-12-31 DIAGNOSIS — E871 Hypo-osmolality and hyponatremia: Secondary | ICD-10-CM | POA: Diagnosis present

## 2019-12-31 DIAGNOSIS — N390 Urinary tract infection, site not specified: Secondary | ICD-10-CM | POA: Diagnosis not present

## 2019-12-31 DIAGNOSIS — I4891 Unspecified atrial fibrillation: Secondary | ICD-10-CM | POA: Diagnosis not present

## 2019-12-31 DIAGNOSIS — E039 Hypothyroidism, unspecified: Secondary | ICD-10-CM | POA: Diagnosis not present

## 2019-12-31 DIAGNOSIS — Z20822 Contact with and (suspected) exposure to covid-19: Secondary | ICD-10-CM | POA: Diagnosis not present

## 2019-12-31 DIAGNOSIS — N179 Acute kidney failure, unspecified: Secondary | ICD-10-CM

## 2019-12-31 DIAGNOSIS — Z79899 Other long term (current) drug therapy: Secondary | ICD-10-CM | POA: Diagnosis not present

## 2019-12-31 DIAGNOSIS — I509 Heart failure, unspecified: Secondary | ICD-10-CM | POA: Diagnosis not present

## 2019-12-31 DIAGNOSIS — E1140 Type 2 diabetes mellitus with diabetic neuropathy, unspecified: Secondary | ICD-10-CM | POA: Insufficient documentation

## 2019-12-31 DIAGNOSIS — I10 Essential (primary) hypertension: Secondary | ICD-10-CM | POA: Diagnosis present

## 2019-12-31 DIAGNOSIS — Z7901 Long term (current) use of anticoagulants: Secondary | ICD-10-CM | POA: Insufficient documentation

## 2019-12-31 DIAGNOSIS — F0390 Unspecified dementia without behavioral disturbance: Secondary | ICD-10-CM | POA: Insufficient documentation

## 2019-12-31 DIAGNOSIS — I5032 Chronic diastolic (congestive) heart failure: Secondary | ICD-10-CM

## 2019-12-31 LAB — BASIC METABOLIC PANEL
Anion gap: 16 — ABNORMAL HIGH (ref 5–15)
BUN: 65 mg/dL — ABNORMAL HIGH (ref 8–23)
CO2: 24 mmol/L (ref 22–32)
Calcium: 9.5 mg/dL (ref 8.9–10.3)
Chloride: 86 mmol/L — ABNORMAL LOW (ref 98–111)
Creatinine, Ser: 1.5 mg/dL — ABNORMAL HIGH (ref 0.44–1.00)
GFR calc Af Amer: 35 mL/min — ABNORMAL LOW (ref >60)
GFR calc non Af Amer: 30 mL/min — ABNORMAL LOW (ref >60)
Glucose, Bld: 569 mg/dL (ref 70–99)
Potassium: 5.5 mmol/L — ABNORMAL HIGH (ref 3.5–5.1)
Sodium: 126 mmol/L — ABNORMAL LOW (ref 135–145)

## 2019-12-31 LAB — CBC
HCT: 38.4 % (ref 36.0–46.0)
Hemoglobin: 13.5 g/dL (ref 12.0–15.0)
MCH: 32.8 pg (ref 26.0–34.0)
MCHC: 35.2 g/dL (ref 30.0–36.0)
MCV: 93.4 fL (ref 80.0–100.0)
Platelets: 184 10*3/uL (ref 150–400)
RBC: 4.11 MIL/uL (ref 3.87–5.11)
RDW: 13.4 % (ref 11.5–15.5)
WBC: 8.1 10*3/uL (ref 4.0–10.5)
nRBC: 0 % (ref 0.0–0.2)

## 2019-12-31 LAB — URINALYSIS, ROUTINE W REFLEX MICROSCOPIC
Bilirubin Urine: NEGATIVE
Glucose, UA: >=500 mg/dL — AB
Hgb urine dipstick: NEGATIVE
Ketones, ur: 5 mg/dL — AB
Nitrite: NEGATIVE
Protein, ur: NEGATIVE mg/dL
Specific Gravity, Urine: 1.011 (ref 1.005–1.030)
pH: 5 (ref 5.0–8.0)

## 2019-12-31 LAB — CBG MONITORING, ED
Glucose-Capillary: 445 mg/dL — ABNORMAL HIGH (ref 70–99)
Glucose-Capillary: 482 mg/dL — ABNORMAL HIGH (ref 70–99)
Glucose-Capillary: 538 mg/dL (ref 70–99)
Glucose-Capillary: 568 mg/dL (ref 70–99)
Glucose-Capillary: 589 mg/dL (ref 70–99)

## 2019-12-31 LAB — TROPONIN I (HIGH SENSITIVITY): Troponin I (High Sensitivity): 22 ng/L — ABNORMAL HIGH (ref <18)

## 2019-12-31 LAB — PROTIME-INR
INR: 1.2 (ref 0.8–1.2)
Prothrombin Time: 14.8 seconds (ref 11.4–15.2)

## 2019-12-31 LAB — BLOOD GAS, VENOUS
Acid-base deficit: 4.1 mmol/L — ABNORMAL HIGH (ref 0.0–2.0)
Bicarbonate: 20.7 mmol/L (ref 20.0–28.0)
O2 Saturation: 78.4 %
Patient temperature: 98.6
pCO2, Ven: 39.3 mmHg — ABNORMAL LOW (ref 44.0–60.0)
pH, Ven: 7.342 (ref 7.250–7.430)
pO2, Ven: 48.1 mmHg — ABNORMAL HIGH (ref 32.0–45.0)

## 2019-12-31 MED ORDER — DEXTROSE 50 % IV SOLN: 0.0000 mL | INTRAVENOUS | Status: DC | PRN

## 2019-12-31 MED ORDER — SODIUM CHLORIDE 0.9 % IV SOLN: 1.0000 g | INTRAVENOUS | Status: DC

## 2019-12-31 MED ORDER — SODIUM CHLORIDE 0.9 % IV SOLN
1.0000 g | Freq: Once | INTRAVENOUS | Status: AC
  Administered 2020-01-01: 1 g via INTRAVENOUS
  Filled 2019-12-31: qty 10

## 2019-12-31 MED ORDER — INSULIN REGULAR(HUMAN) IN NACL 100-0.9 UT/100ML-% IV SOLN: INTRAVENOUS | Status: DC

## 2019-12-31 MED ORDER — ONDANSETRON HCL 4 MG/2ML IJ SOLN: 4.0000 mg | Freq: Four times a day (QID) | INTRAMUSCULAR | Status: DC | PRN

## 2019-12-31 MED ORDER — SODIUM CHLORIDE 0.9 % IV BOLUS
1000.0000 mL | INTRAVENOUS | Status: AC
  Administered 2019-12-31: 1000 mL via INTRAVENOUS

## 2019-12-31 MED ORDER — INSULIN REGULAR(HUMAN) IN NACL 100-0.9 UT/100ML-% IV SOLN
INTRAVENOUS | Status: DC
  Administered 2019-12-31: 7.5 [IU]/h via INTRAVENOUS
  Filled 2019-12-31: qty 100

## 2019-12-31 MED ORDER — ACETAMINOPHEN 650 MG RE SUPP: 650.0000 mg | Freq: Four times a day (QID) | RECTAL | Status: DC | PRN

## 2019-12-31 MED ORDER — ACETAMINOPHEN 325 MG PO TABS: 650.0000 mg | ORAL_TABLET | Freq: Four times a day (QID) | ORAL | Status: DC | PRN

## 2019-12-31 MED ORDER — DEXTROSE-NACL 5-0.45 % IV SOLN: INTRAVENOUS | Status: DC

## 2019-12-31 MED ORDER — LEVOTHYROXINE SODIUM 50 MCG PO TABS
50.0000 ug | ORAL_TABLET | Freq: Every day | ORAL | Status: DC
  Administered 2020-01-01: 50 ug via ORAL
  Filled 2019-12-31: qty 1

## 2019-12-31 MED ORDER — DOCUSATE SODIUM 100 MG PO CAPS: 100.0000 mg | ORAL_CAPSULE | Freq: Two times a day (BID) | ORAL | Status: DC

## 2019-12-31 MED ORDER — HYDROCODONE-ACETAMINOPHEN 5-325 MG PO TABS: 1.0000 | ORAL_TABLET | ORAL | Status: DC | PRN

## 2019-12-31 MED ORDER — ONDANSETRON HCL 4 MG PO TABS: 4.0000 mg | ORAL_TABLET | Freq: Four times a day (QID) | ORAL | Status: DC | PRN

## 2019-12-31 MED ORDER — SODIUM CHLORIDE 0.9 % IV SOLN: INTRAVENOUS | Status: DC

## 2019-12-31 MED ORDER — SODIUM CHLORIDE 0.9% FLUSH: 3.0000 mL | Freq: Two times a day (BID) | INTRAVENOUS | Status: DC

## 2019-12-31 MED ORDER — APIXABAN 2.5 MG PO TABS
2.5000 mg | ORAL_TABLET | Freq: Two times a day (BID) | ORAL | Status: DC
  Administered 2020-01-01: 2.5 mg via ORAL
  Filled 2019-12-31 (×2): qty 1

## 2019-12-31 NOTE — ED Notes (Signed)
ED Provider at bedside. 

## 2019-12-31 NOTE — H&P (Signed)
Judy Kent SWH:[id number] DOB: 24-Sep-1926 DOA: 12/31/2019     PCP: Reynold Bowen, MD   Outpatient Specialists:   CARDS:   Dr. Einar Gip    Patient arrived to ER on 12/31/19 at 1759 Referred by Attending Sherwood Gambler, MD   Patient coming from: home Lives  With family    Chief Complaint:   Chief Complaint  Patient presents with  . Hyperglycemia    HPI: Judy Kent is a 86 y.o. female with medical history significant of Dm2 on insuline, diastolic CHF, HTN, A. Fib on coumadin , Hypothyrodism    Presented with have been having elevated BG for the past few days BG has been the highest today it was up to 600.  Of note per family 2 weeks prior to that his blood sugar has been staying in the 40s and 50s occasionally she had to skip her insulin. She has Libre sensor at it started to say high today, it does that when it goes up above 500 Family thinks she has been eating all the right things   mild confusion this AM  Daughter noted this AM resolved by the time patient presented to emergency department Family is concerned as her blood sugar has been elevated in the past when she has urinary tract infection and that usually is similar presentation   Infectious risk factors:  Reports fatigue   Has  Not been vaccinated against COVID (does not want it)   Initial COVID TEST   in house  PCR testing  Pending  No results found for: SARSCOV2NAA   Regarding pertinent Chronic problems:     Hyperlipidemia -  Not on statins    HTN on NOrvasc, lisinopril    chronic CHF diastolic  - last echo 6659 preserved EF On Torsemide     DM 2 -  Lab Results  Component Value Date   HGBA1C 7.9 (H) 06/08/2017   on insulin,    Hypothyroidism: on synthroid    Hx of TIA - with/out residual deficits     A. Fib -  - CHA2DS2 vas score    6     current  on anticoagulation with   Eliquis           CKD stage III - baseline Cr 1.1    Lab Results  Component Value Date   CREATININE 1.50  (H) 12/31/2019   CREATININE 1.12 (H) 07/16/2017   CREATININE 0.96 07/15/2017      While in ER: BG was up to 589 Started on insuline UA ws suspicious started rocephin Urine cult ordered   Hospitalist was called for admission for Hyperglycemia and possible UTI  The following Work up has been ordered so far:  Orders Placed This Encounter  Procedures  . DG Chest Portable 1 View  . Basic metabolic panel  . CBC  . Urinalysis, Routine w reflex microscopic  . Blood gas, venous (at [facility] and [facility], not at [facility])  . Diet NPO time specified  . [place] Lock IV, Maintain IV access  . Cardiac monitoring  . Initiate Carrier Fluid Protocol  . Notify physician  . If present, discontinue Insulin Pump after IV Insulin is initiated.  . Do NOT use lab glucose values in [employer].  If CBG meter reads "Critical High", enter 600.  . Consult to hospitalist  ALL PATIENTS BEING ADMITTED/HAVING PROCEDURES NEED COVID-19 SCREENING  . Pulse oximetry, continuous  . CBG monitoring, ED  . CBG monitoring, ED  . ED EKG  .  Insert peripheral IV     Following Medications were ordered in ER: Medications  insulin regular, human (MYXREDLIN) 100 units/ 100 mL infusion (7.5 Units/hr Intravenous New Bag/Given 12/31/19 2201)  0.9 %  sodium chloride infusion ( Intravenous New Bag/Given 12/31/19 2157)  dextrose 5 %-0.45 % sodium chloride infusion (has no administration in time range)  dextrose 50 % solution 0-50 mL (has no administration in time range)  sodium chloride 0.9 % bolus 1,000 mL (1,000 mLs Intravenous New Bag/Given 12/31/19 2044)        Consult Orders  (From admission, onward)         Start     Ordered   12/31/19 2135  Consult to hospitalist  ALL PATIENTS BEING ADMITTED/HAVING PROCEDURES NEED COVID-19 SCREENING  Once       Comments: ALL PATIENTS BEING ADMITTED/HAVING PROCEDURES NEED COVID-19 SCREENING  Provider:  (Not yet assigned)  Question Answer Comment  Place call to: [facility]   Reason for  Consult Admit      12/31/19 2134          Significant initial  Findings: Abnormal Labs Reviewed  BASIC METABOLIC PANEL - Abnormal; Notable for the following components:      Result Value   Sodium 126 (*)    Potassium 5.5 (*)    Chloride 86 (*)    Glucose, Bld 569 (*)    BUN 65 (*)    Creatinine, Ser 1.50 (*)    GFR calc non Af Amer 30 (*)    GFR calc Af Amer 35 (*)    Anion gap 16 (*)    All other components within normal limits  URINALYSIS, ROUTINE W REFLEX MICROSCOPIC - Abnormal; Notable for the following components:   APPearance HAZY (*)    Glucose, UA >=500 (*)    Ketones, ur 5 (*)    Leukocytes,Ua MODERATE (*)    Bacteria, UA RARE (*)    All other components within normal limits  BLOOD GAS, VENOUS - Abnormal; Notable for the following components:   pCO2, Ven 39.3 (*)    pO2, Ven 48.1 (*)    Acid-base deficit 4.1 (*)    All other components within normal limits  CBG MONITORING, ED - Abnormal; Notable for the following components:   Glucose-Capillary 538 (*)    All other components within normal limits  CBG MONITORING, ED - Abnormal; Notable for the following components:   Glucose-Capillary 589 (*)    All other components within normal limits    Otherwise labs showing:    Recent Labs  Lab 12/31/19 1836  NA 126*  K 5.5*  CO2 24  GLUCOSE 569*  BUN 65*  CREATININE 1.50*  CALCIUM 9.5    Cr    Up from baseline see below Lab Results  Component Value Date   CREATININE 1.50 (H) 12/31/2019   CREATININE 1.12 (H) 07/16/2017   CREATININE 0.96 07/15/2017    No results for input(s): AST, ALT, ALKPHOS, BILITOT, PROT, ALBUMIN in the last 168 hours. Lab Results  Component Value Date   CALCIUM 9.5 12/31/2019   PHOS 3.2 06/09/2017      WBC      Component Value Date/Time   WBC 8.1 12/31/2019 1836   ANC No results found for: NEUTROABS ALC No components found for: LYMPHAB    Plt: Lab Results  Component Value Date   PLT 184 12/31/2019     COVID-19  Labs  No results for input(s): DDIMER, FERRITIN, LDH, CRP in the last  72 hours.  No results found for: SARSCOV2NAA     HG/HCT stable,       Component Value Date/Time   HGB 13.5 12/31/2019 1836   HCT 38.4 12/31/2019 1836    No results for input(s): LIPASE, AMYLASE in the last 168 hours. No results for input(s): AMMONIA in the last 168 hours.    Troponin  ordered     ECG: Ordered Personally reviewed by me showing: HR : 61 Rhythm:   A.fib   no evidence of ischemic changes QTC 430  DM  labs:  HbA1C: No results for input(s): HGBA1C in the last 8760 hours.     CBG (last 3)  Recent Labs    12/31/19 1824 12/31/19 2139  GLUCAP 538* 589*       UA  ? evidence of UTI     Urine analysis:    Component Value Date/Time   COLORURINE YELLOW 12/31/2019 1848   APPEARANCEUR HAZY (A) 12/31/2019 1848   LABSPEC 1.011 12/31/2019 1848   PHURINE 5.0 12/31/2019 1848   GLUCOSEU >=500 (A) 12/31/2019 1848   HGBUR NEGATIVE 12/31/2019 1848   BILIRUBINUR NEGATIVE 12/31/2019 1848   KETONESUR 5 (A) 12/31/2019 1848   PROTEINUR NEGATIVE 12/31/2019 1848   NITRITE NEGATIVE 12/31/2019 1848   LEUKOCYTESUR MODERATE (A) 12/31/2019 1848      Ordered   CXR -  Bilateral pleural effusion      ED Triage Vitals  Enc Vitals Group     BP 12/31/19 1825 (!) 127/57     Pulse Rate 12/31/19 1825 64     Resp 12/31/19 1825 14     Temp 12/31/19 1825 97.7 F (36.5 C)     Temp Source 12/31/19 1825 Oral     SpO2 12/31/19 1825 100 %     Weight 12/31/19 1825 112 lb (50.8 kg)     Height 12/31/19 1825 5' (1.524 m)     Head Circumference --      Peak Flow --      Pain Score 12/31/19 1832 0     Pain Loc --      Pain Edu? --      Excl. in [place]? --   TMAX(24)@       Latest  Blood pressure (!) 110/97, pulse (!) 55, temperature 97.7 F (36.5 C), temperature source Oral, resp. rate (!) 25, height 5' (1.524 m), weight 50.8 kg, SpO2 96 %.    Review of Systems:    Pertinent positives include:  Fatigue,   confusion  Constitutional:  No weight loss, night sweats, Fevers, chills, weight loss  HEENT:  No headaches, Difficulty swallowing,Tooth/dental problems,Sore throat,  No sneezing, itching, ear ache, nasal congestion, post nasal drip,  Cardio-vascular:  No chest pain, Orthopnea, PND, anasarca, dizziness, palpitations.no Bilateral lower extremity swelling  GI:  No heartburn, indigestion, abdominal pain, nausea, vomiting, diarrhea, change in bowel habits, loss of appetite, melena, blood in stool, hematemesis Resp:  no shortness of breath at rest. No dyspnea on exertion, No excess mucus, no productive cough, No non-productive cough, No coughing up of blood.No change in color of mucus.No wheezing. Skin:  no rash or lesions. No jaundice GU:  no dysuria, change in color of urine, no urgency or frequency. No straining to urinate.  No flank pain.  Musculoskeletal:  No joint pain or no joint swelling. No decreased range of motion. No back pain.  Psych:  No change in mood or affect. No depression or anxiety. No memory loss.  Neuro: no localizing  neurological complaints, no tingling, no weakness, no double vision, no gait abnormality, no slurred speech, no  All systems reviewed and apart from Harold all are negative  Past Medical History:   Past Medical History:  Diagnosis Date  . A-fib ([place])   . Anemia   . Chronic diastolic CHF (congestive heart failure) ([place])   . Dementia ([place])   . Diabetic peripheral neuropathy ([place])    "not much feeling from feet up to my waist" (07/15/2017)  . History of blood transfusion ~ 2014   "low HgB" (07/15/2017)  . HTN (hypertension)   . Hypothyroidism   . PAF (paroxysmal atrial fibrillation) ([place])   . PONV (postoperative nausea and vomiting)   . TIA (transient ischemic attack) ~ 2014; 2015   "after receiving blood; just happened, not hospitalized" (07/15/2017)  . Type II diabetes mellitus ([place])    "was told I'm type 1 now cause Insulin production has  stopped" (07/15/2017)      Past Surgical History:  Procedure Laterality Date  . CATARACT EXTRACTION, BILATERAL Bilateral   . FRACTURE SURGERY    . TIBIA FRACTURE SURGERY Left    "I have 9 screws in there"  . TONSILLECTOMY      Social History:  Ambulatory  walker      reports that she has never smoked. She has never used smokeless tobacco. She reports that she does not drink alcohol and does not use drugs.     Family History:    Family History  Problem Relation Age of Onset  . Heart attack Other 33  . Lung cancer Father     Allergies: Allergies  Allergen Reactions  . Cantaloupe (Diagnostic) Swelling  . Sulfa Antibiotics Shortness Of Breath     Prior to Admission medications   Medication Sig Start Date End Date Taking? Authorizing Provider  amLODipine (NORVASC) 10 MG tablet TAKE 1 TABLET BY MOUTH EVERY DAY 07/13/19  Yes Adrian Prows, MD  b complex vitamins tablet Take 1 tablet by mouth every evening.    Yes [provider]  Cholecalciferol (VITAMIN D3) 5000 units CAPS Take 5,000 Units by mouth every evening.    Yes [provider]  Cranberry 1000 MG CAPS Take 1 capsule by mouth daily.   Yes [provider]  ELIQUIS 2.5 MG TABS tablet TAKE 1 TABLET TWICE A DAY 07/18/19  Yes Adrian Prows, MD  FeAspGl-FeFum-B12-FA-C-Succ Ac [facility] 28) MISC Take 28 mg by mouth every other day.   Yes [provider]  Insulin Glargine, 2 Unit Dial, (TOUJEO MAX SOLOSTAR) 300 UNIT/ML SOPN Inject 24-30 Units into the skin daily.    Yes [provider]  insulin lispro (HUMALOG) 100 UNIT/ML cartridge Inject 0-4 Units into the skin 3 (three) times daily with meals. 1 unit per 12 carbs per sliding scale,adding  1 unit if BS is 150-200.   Yes [provider]  levothyroxine (SYNTHROID, LEVOTHROID) 50 MCG tablet Take 50 mcg by mouth daily before breakfast.   Yes [provider]  lisinopril (PRINIVIL,ZESTRIL) 10 MG tablet Take 10 mg by mouth at  bedtime.    Yes [provider]  multivitamin-lutein (OCUVITE-LUTEIN) CAPS capsule Take 1 capsule by mouth daily.   Yes [provider]  Omega 3-6-9 Fatty Acids (OMEGA 3-6-9 COMPLEX PO) Take 1 capsule by mouth 2 (two) times daily.   Yes [provider]  Probiotic Product (PROBIOTIC PO) Take 2 capsules by mouth daily.   Yes [provider]  torsemide (DEMADEX) 20 MG tablet  TAKE 1 TABLET BY MOUTH 2 TIMES DAILY. ONE IN THE MORNING AND ONE IN THE AFTERNOON Patient taking differently: Take 20 mg by mouth every other day.  05/08/19  Yes Adrian Prows, MD  insulin degludec (TRESIBA FLEXTOUCH) 100 UNIT/ML SOPN FlexTouch Pen Inject 0.2 mLs (20 Units total) into the skin daily. Patient not taking: Reported on 12/19/2018 06/10/17   Patrecia Pour, Christean Grief, MD  Lutein 20 MG CAPS Take by mouth. Patient not taking: Reported on 12/31/2019    [provider]  mupirocin ointment (BACTROBAN) 2 % Apply 1 application topically 3 (three) times daily. Patient not taking: Reported on 12/31/2019    [provider]   Physical Exam: Vitals with BMI 12/31/2019 12/31/2019 12/31/2019  Height - - -  Weight - - -  BMI - - -  Systolic [phone]  Diastolic 97 57 45  Pulse 55 52 57     1. General:  in No  Acute distress   Chronically ill  -appearing 2. Psychological: Alert and  Oriented 3. Head/ENT:     Dry Mucous Membranes                          Head Non traumatic, neck supple                          Poor Dentition 4. SKIN:   decreased Skin turgor,  Skin clean Dry and intact no rash 5. Heart: Regular rate and rhythm systolic  Murmur, no Rub or gallop 6. Lungs:  no wheezes or crackles   7. Abdomen: Soft, non-tender, Non distended  bowel sounds present 8. Lower extremities: no clubbing, cyanosis, no   edema 9. Neurologically Grossly intact, moving all 4 extremities equally   10. MSK: Normal range of motion   All other LABS:     Recent Labs  Lab 12/31/19 1836    WBC 8.1  HGB 13.5  HCT 38.4  MCV 93.4  PLT 184     Recent Labs  Lab 12/31/19 1836  NA 126*  K 5.5*  CL 86*  CO2 24  GLUCOSE 569*  BUN 65*  CREATININE 1.50*  CALCIUM 9.5     No results for input(s): AST, ALT, ALKPHOS, BILITOT, PROT, ALBUMIN in the last 168 hours.     Cultures:    Component Value Date/Time   SDES URINE, RANDOM 07/16/2017 1850   SPECREQUEST NONE 07/16/2017 1850   CULT <10,000 COLONIES/mL INSIGNIFICANT GROWTH (A) 07/16/2017 1850   REPTSTATUS 07/18/2017 FINAL 07/16/2017 1850     Radiological Exams on Admission: DG Chest Portable 1 View  Result Date: 12/31/2019 CLINICAL DATA:  history of afib and CHF; hyperglycemic EXAM: PORTABLE CHEST 1 VIEW COMPARISON:  Radiograph 07/16/2017 FINDINGS: Chronic cardiomegaly, unchanged. Unchanged mediastinal contours with aortic atherosclerosis. There are trace bilateral pleural effusions. No pulmonary edema. No focal airspace disease. No pneumothorax. Remote left rib fracture. IMPRESSION: Chronic cardiomegaly. Trace bilateral pleural effusions. Aortic Atherosclerosis (ICD10-I70.0). Electronically Signed   By: Keith Rake M.D.   On: 12/31/2019 20:45    Chart has been reviewed    Assessment/Plan  86 y.o. female with medical history significant of Dm2 on insuline, diastolic CHF, HTN, A. Fib on coumadin , Hypothyrodism  Admitted for hyperglycemia and possible uti  Present on Admission: . Hyperglycemia -insulin stabilized initiated.  Continue gentle rehydration avoid over aggressive rehydration given history of CHF.  Continue to follow blood sugar and transition  to home regimen when stable No evidence of DKA at  the time We will repeat to be meant to check electrolytes given elevated potassium  . A-fib ([place]) -chronic stable continue Eliquis, and given soft blood pressures allow hypertension for tonight and restart blood pressure medications when able.   Marland Kitchen HTN (hypertension)  given soft blood pressures allow  hypertension for tonight and restart blood pressure medications when able. Given AKI and hyper kalemia which . Chronic diastolic CHF (congestive heart failure) ([facility]) -hold torsemide for tonight given soft blood pressures and hypoglycemia gently rehydrate and follow over avoid over aggressive fluid resuscitation and fluid overload   . Acute lower UTI -obtain UA urine culture.  Patient has a past that similar presentations with hypoglycemia and confusion associated with UTI for now continue antibiotics pending results of urine culture  . AKI (acute kidney injury) ([place]) gently rehydrate obtain urine electrolytes most likely secondary to dehydration secondary to hyperglycemia   . Hyponatremia -corrects, in the setting of hypoglycemia we will continue to follow  . Hyperkalemia -repeat after rehydrated and insulin administered. Given AKI will hold lisinopril  Cardiac Murmur - will obtain echo  Other plan as per orders.  DVT prophylaxis:    Eliquis      Code Status:    Code Status: Prior FULL CODE   as per patient   I had personally discussed CODE STATUS with patient and family     Family Communication:   Family  at  Bedside  plan of care was discussed with   Daughter   Disposition Plan:    To home once workup is complete and patient is stable   Following barriers for discharge:                            Electrolytes corrected                                                                                       Will need to be able to tolerate PO                          EXPECT DC tomorrow                   Would benefit from PT/OT eval prior to DC  Ordered                    Diabetes care coordinator                                       Consults called: none    Admission status:  ED Disposition    ED Disposition Condition [place]: [place] [[id number]]  Level of Care: Stepdown [14]  Admit to SDU based on following criteria: Other see  comments  Comments: hyperglycemia  Covid Evaluation: Asymptomatic Screening Protocol (No Symptoms)  Diagnosis: Hyperglycemia [160737]  Admitting Physician: Toy Baker [[id number]  Attending Physician: Roel Cluck  Calyn Sivils [[id number]]        Obs       Level of care      SDU tele indefinitely please discontinue once patient no longer qualifies     No results found for: SARSCOV2NAA   Precautions: admitted as  asymptomatic screening protocol    PPE: Used by the provider:   N95   eye Goggles,  Gloves     Josetta Wigal 12/31/2019, 11:51 PM    [facility]     after 2 AM please page floor coverage PA If 7AM-7PM, please contact the day team taking care of the patient using [email or web address]   Patient was evaluated in the context of the global COVID-19 pandemic, which necessitated consideration that the patient might be at risk for infection with the SARS-CoV-2 virus that causes COVID-19. Institutional protocols and algorithms that pertain to the evaluation of patients at risk for COVID-19 are in a state of rapid change based on information released by regulatory bodies including the CDC and federal and state organizations. These policies and algorithms were followed during the patient's care.

## 2019-12-31 NOTE — ED Triage Notes (Signed)
Patient's CBG in triage-[id number]. Patient denies any N/V or abdominal pain. Patient's daughter reports that the patient had some confusion this AM.

## 2019-12-31 NOTE — ED Provider Notes (Signed)
[facility] DEPT Provider Note   CSN: [id number] Arrival date & time: 12/31/19  1759     History Chief Complaint  Patient presents with  . Hyperglycemia    [place] Judy Kent is a 86 y.o. female.  HPI   Patient is a 86 year old female with a medical history as noted below. She is anticoagulated on Eliquis due to her history of atrial fibrillation. Her daughter states that recently patient has been experiencing elevated blood sugars. The day today her blood sugar ran "high". She was given insulin but they found her blood sugars were still elevated and they decided to bring her to the ED for evaluation. Her daughter notes an episode of confusion this AM in which "she seemed to have a more difficult time figuring out the order of things when getting dressed" but this has since resolved and her daughter states that she is at her baseline currently. Pt has no complaints at this time. No fevers, chills, CP, SOB, abd pain, n/v/d, dysuria.      Past Medical History:  Diagnosis Date  . A-fib ([place])   . Anemia   . Chronic diastolic CHF (congestive heart failure) ([place])   . Dementia ([place])   . Diabetic peripheral neuropathy ([place])    "not much feeling from feet up to my waist" (07/15/2017)  . History of blood transfusion ~ 2014   "low HgB" (07/15/2017)  . HTN (hypertension)   . Hypothyroidism   . PAF (paroxysmal atrial fibrillation) ([place])   . PONV (postoperative nausea and vomiting)   . TIA (transient ischemic attack) ~ 2014; 2015   "after receiving blood; just happened, not hospitalized" (07/15/2017)  . Type II diabetes mellitus ([place])    "was told I'm type 1 now cause Insulin production has stopped" (07/15/2017)    Patient Active Problem List   Diagnosis Date Noted  . DM2 (diabetes mellitus, type 2) ([place]) 07/15/2017  . HTN (hypertension) 07/15/2017  . A-fib ([place]) 07/15/2017  . Acute on chronic diastolic (congestive) heart failure ([place]) 07/15/2017  . Acute on  chronic diastolic CHF (congestive heart failure) ([place]) 06/08/2017    Past Surgical History:  Procedure Laterality Date  . CATARACT EXTRACTION, BILATERAL Bilateral   . FRACTURE SURGERY    . TIBIA FRACTURE SURGERY Left    "I have 9 screws in there"  . TONSILLECTOMY       OB History   No obstetric history on file.     Family History  Problem Relation Age of Onset  . Heart attack Other 33  . Lung cancer Father     Social History   Tobacco Use  . Smoking status: Never Smoker  . Smokeless tobacco: Never Used  Vaping Use  . Vaping Use: Never used  Substance Use Topics  . Alcohol use: No  . Drug use: No    Home Medications Prior to Admission medications   Medication Sig Start Date End Date Taking? Authorizing Provider  amLODipine (NORVASC) 10 MG tablet TAKE 1 TABLET BY MOUTH EVERY DAY 07/13/19   Adrian Prows, MD  b complex vitamins tablet Take 1 tablet by mouth daily.    [provider]  Cholecalciferol (VITAMIN D3) 5000 units CAPS Take 5,000 Units by mouth daily.    [provider]  ELIQUIS 2.5 MG TABS tablet TAKE 1 TABLET TWICE A DAY 07/18/19   Adrian Prows, MD  FeAspGl-FeFum-B12-FA-C-Succ Ac (FERREX 28) MISC Take 28 mg by mouth every other day.    [provider]  insulin degludec (TRESIBA FLEXTOUCH) 100 UNIT/ML SOPN FlexTouch Pen Inject 0.2 mLs (20 Units total) into the skin daily. Patient not taking: Reported on 12/19/2018 06/10/17   Patrecia Pour, Christean Grief, MD  Insulin Glargine, 2 Unit Dial, (TOUJEO MAX SOLOSTAR) 300 UNIT/ML SOPN Inject 24 Units into the skin daily.    [provider]  insulin lispro (HUMALOG) 100 UNIT/ML cartridge Inject 2-4 Units into the skin See admin instructions. 1 unit per 15 carbs per sliding scale,adding  1 unit if BS is 150-200.     [provider]  levothyroxine (SYNTHROID, LEVOTHROID) 50 MCG tablet Take 50 mcg by mouth daily before breakfast.    [provider]  lisinopril (PRINIVIL,ZESTRIL) 10 MG  tablet Take 10 mg by mouth at bedtime.     [provider]  Lutein 20 MG CAPS Take by mouth.    [provider]  multivitamin-lutein (OCUVITE-LUTEIN) CAPS capsule Take 1 capsule by mouth daily.    [provider]  mupirocin ointment (BACTROBAN) 2 % Apply 1 application topically 3 (three) times daily.    [provider]  Omega 3-6-9 Fatty Acids (OMEGA 3-6-9 COMPLEX PO) Take 1 capsule by mouth 2 (two) times daily.    [provider]  torsemide (DEMADEX) 20 MG tablet TAKE 1 TABLET BY MOUTH 2 TIMES DAILY. ONE IN THE MORNING AND ONE IN THE AFTERNOON Patient taking differently: Take 20 mg by mouth every other day.  05/08/19   Adrian Prows, MD    Allergies    Cantaloupe (diagnostic) and Sulfa antibiotics  Review of Systems   Review of Systems  All other systems reviewed and are negative. Ten systems reviewed and are negative for acute change, except as noted in the HPI.    Physical Exam Updated Vital Signs BP (!) 134/45 (BP Location: Right Arm)   Pulse (!) 57   Temp 97.7 F (36.5 C) (Oral)   Resp 18   Ht 5' (1.524 m)   Wt 50.8 kg   SpO2 100%   BMI 21.87 kg/m    Physical Exam Vitals and nursing note reviewed.  Constitutional:      General: She is not in acute distress.    Appearance: Normal appearance. She is not ill-appearing, toxic-appearing or diaphoretic.  HENT:     Head: Normocephalic and atraumatic.     Right Ear: External ear normal.     Left Ear: External ear normal.     Nose: Nose normal.     Mouth/Throat:     Mouth: Mucous membranes are moist.     Pharynx: Oropharynx is clear. No oropharyngeal exudate or posterior oropharyngeal erythema.  Eyes:     General: No scleral icterus.       Right eye: No discharge.        Left eye: No discharge.     Extraocular Movements: Extraocular movements intact.     Conjunctiva/sclera: Conjunctivae normal.  Cardiovascular:     Rate and Rhythm: Normal rate. Rhythm irregular.     Pulses:  Normal pulses.     Heart sounds: Murmur heard.  No friction rub. No gallop.   Pulmonary:     Effort: Pulmonary effort is normal. No respiratory distress.     Breath sounds: Normal breath sounds. No stridor. No wheezing, rhonchi or rales.  Abdominal:     General: Abdomen is flat.     Palpations: Abdomen is soft.     Tenderness: There is no abdominal tenderness.  Musculoskeletal:  General: Normal range of motion.     Cervical back: Normal range of motion and neck supple. No tenderness.  Skin:    General: Skin is warm and dry.  Neurological:     General: No focal deficit present.     Mental Status: She is alert and oriented to person, place, and time.  Psychiatric:        Mood and Affect: Mood normal.        Behavior: Behavior normal.    ED Results / Procedures / Treatments   Labs (all labs ordered are listed, but only abnormal results are displayed) Labs Reviewed  BASIC METABOLIC PANEL - Abnormal; Notable for the following components:      Result Value   Sodium 126 (*)    Potassium 5.5 (*)    Chloride 86 (*)    Glucose, Bld 569 (*)    BUN 65 (*)    Creatinine, Ser 1.50 (*)    GFR calc non Af Amer 30 (*)    GFR calc Af Amer 35 (*)    Anion gap 16 (*)    All other components within normal limits  URINALYSIS, ROUTINE W REFLEX MICROSCOPIC - Abnormal; Notable for the following components:   APPearance HAZY (*)    Glucose, UA >=500 (*)    Ketones, ur 5 (*)    Leukocytes,Ua MODERATE (*)    Bacteria, UA RARE (*)    All other components within normal limits  BLOOD GAS, VENOUS - Abnormal; Notable for the following components:   pCO2, Ven 39.3 (*)    pO2, Ven 48.1 (*)    Acid-base deficit 4.1 (*)    All other components within normal limits  CBG MONITORING, ED - Abnormal; Notable for the following components:   Glucose-Capillary 538 (*)    All other components within normal limits  CBG MONITORING, ED - Abnormal; Notable for the following components:   Glucose-Capillary  589 (*)    All other components within normal limits  SARS CORONAVIRUS 2 BY RT PCR (HOSPITAL ORDER, [facility])  URINE CULTURE  CBC  PROTIME-INR  CBG MONITORING, ED  TROPONIN I (HIGH SENSITIVITY)   EKG None  Radiology DG Chest Portable 1 View  Result Date: 12/31/2019 CLINICAL DATA:  history of afib and CHF; hyperglycemic EXAM: PORTABLE CHEST 1 VIEW COMPARISON:  Radiograph 07/16/2017 FINDINGS: Chronic cardiomegaly, unchanged. Unchanged mediastinal contours with aortic atherosclerosis. There are trace bilateral pleural effusions. No pulmonary edema. No focal airspace disease. No pneumothorax. Remote left rib fracture. IMPRESSION: Chronic cardiomegaly. Trace bilateral pleural effusions. Aortic Atherosclerosis (ICD10-I70.0). Electronically Signed   By: Keith Rake M.D.   On: 12/31/2019 20:45   Procedures Procedures (including critical care time)  Medications Ordered in ED Medications  insulin regular, human (MYXREDLIN) 100 units/ 100 mL infusion (7.5 Units/hr Intravenous New Bag/Given 12/31/19 2201)  0.9 %  sodium chloride infusion ( Intravenous New Bag/Given 12/31/19 2157)  dextrose 5 %-0.45 % sodium chloride infusion (has no administration in time range)  dextrose 50 % solution 0-50 mL (has no administration in time range)  cefTRIAXone (ROCEPHIN) 1 g in sodium chloride 0.9 % 100 mL IVPB (has no administration in time range)  sodium chloride 0.9 % bolus 1,000 mL (1,000 mLs Intravenous New Bag/Given 12/31/19 2044)   ED Course  I have reviewed the triage vital signs and the nursing notes.  Pertinent labs & imaging results that were available during my care of the patient were reviewed by me and  considered in my medical decision making (see chart for details).  Clinical Course as of Dec 31 2215  Mon Dec 31, 2019  2003 Glucose(!!): 569 [LJ]  2004 Anion gap(!): 16 [LJ]  2004 Ketones, ur(!): 5 [LJ]  2019 Chloride(!): 86 [LJ]  2019 Potassium(!): 5.5 [LJ]    2020 Corrected sodium is 134  Sodium(!): 126 [LJ]  2204 Patient's daughter told my attending physician that in the past she will become hyperglycemic secondary to UTI.  Patient does have moderate leukocytes and rare bacteria.  Will cover empirically with 1 g of IV Rocephin.   [LJ]  2215 Atrial fibrillation  ED EKG [LJ]  2215 Chalmers Guest): MODERATE [LJ]  2216 Bacteria, UA(!): RARE [LJ]    Clinical Course User Index [LJ] Rayna Sexton, PA-C    MDM Rules/Calculators/A&P                          Pt is a 86 y.o. female that present with a history, physical exam, ED Clinical Course as noted above.   Pt presents today due to hyperglycemia. Her daughter states that in the past she gets hyperglycemic 2/2 to UTI. She does have moderate leukocytes and rare bacteria. Pt given 1g of rocephin.  Pt started on insulin and IVF. Discussed with hospitalist for admission. COVID-19 test ordered.  Note: Portions of this report may have been transcribed using voice recognition software. Every effort was made to ensure accuracy; however, inadvertent computerized transcription errors may be present.   Final Clinical Impression(s) / ED Diagnoses Final diagnoses:  Hyperglycemia   Rx / DC Orders ED Discharge Orders    None       Rayna Sexton, PA-C 12/31/19 2222    Sherwood Gambler, MD 01/01/20 1525

## 2020-01-01 ENCOUNTER — Other Ambulatory Visit (HOSPITAL_COMMUNITY): Payer: Medicare HMO

## 2020-01-01 DIAGNOSIS — N390 Urinary tract infection, site not specified: Secondary | ICD-10-CM | POA: Diagnosis not present

## 2020-01-01 DIAGNOSIS — I4891 Unspecified atrial fibrillation: Secondary | ICD-10-CM | POA: Diagnosis not present

## 2020-01-01 DIAGNOSIS — R739 Hyperglycemia, unspecified: Secondary | ICD-10-CM | POA: Diagnosis not present

## 2020-01-01 DIAGNOSIS — N179 Acute kidney failure, unspecified: Secondary | ICD-10-CM | POA: Diagnosis not present

## 2020-01-01 LAB — CBC WITH DIFFERENTIAL/PLATELET
Abs Immature Granulocytes: 0.02 10*3/uL (ref 0.00–0.07)
Basophils Absolute: 0 10*3/uL (ref 0.0–0.1)
Basophils Relative: 0 %
Eosinophils Absolute: 0.1 10*3/uL (ref 0.0–0.5)
Eosinophils Relative: 1 %
HCT: 33.7 % — ABNORMAL LOW (ref 36.0–46.0)
Hemoglobin: 11.8 g/dL — ABNORMAL LOW (ref 12.0–15.0)
Immature Granulocytes: 0 %
Lymphocytes Relative: 19 %
Lymphs Abs: 1.6 10*3/uL (ref 0.7–4.0)
MCH: 32.1 pg (ref 26.0–34.0)
MCHC: 35 g/dL (ref 30.0–36.0)
MCV: 91.6 fL (ref 80.0–100.0)
Monocytes Absolute: 1.5 10*3/uL — ABNORMAL HIGH (ref 0.1–1.0)
Monocytes Relative: 18 %
Neutro Abs: 4.8 10*3/uL (ref 1.7–7.7)
Neutrophils Relative %: 62 %
Platelets: 167 10*3/uL (ref 150–400)
RBC: 3.68 MIL/uL — ABNORMAL LOW (ref 3.87–5.11)
RDW: 13.5 % (ref 11.5–15.5)
WBC: 8 10*3/uL (ref 4.0–10.5)
nRBC: 0 % (ref 0.0–0.2)

## 2020-01-01 LAB — COMPREHENSIVE METABOLIC PANEL
ALT: 34 U/L (ref 0–44)
AST: 37 U/L (ref 15–41)
Albumin: 3.6 g/dL (ref 3.5–5.0)
Alkaline Phosphatase: 63 U/L (ref 38–126)
Anion gap: 10 (ref 5–15)
BUN: 58 mg/dL — ABNORMAL HIGH (ref 8–23)
CO2: 25 mmol/L (ref 22–32)
Calcium: 9 mg/dL (ref 8.9–10.3)
Chloride: 99 mmol/L (ref 98–111)
Creatinine, Ser: 1.25 mg/dL — ABNORMAL HIGH (ref 0.44–1.00)
GFR calc Af Amer: 44 mL/min — ABNORMAL LOW (ref >60)
GFR calc non Af Amer: 38 mL/min — ABNORMAL LOW (ref >60)
Glucose, Bld: 96 mg/dL (ref 70–99)
Potassium: 3.8 mmol/L (ref 3.5–5.1)
Sodium: 134 mmol/L — ABNORMAL LOW (ref 135–145)
Total Bilirubin: 1 mg/dL (ref 0.3–1.2)
Total Protein: 5.8 g/dL — ABNORMAL LOW (ref 6.5–8.1)

## 2020-01-01 LAB — TSH: TSH: 1.486 u[IU]/mL (ref 0.350–4.500)

## 2020-01-01 LAB — PHOSPHORUS: Phosphorus: 2.7 mg/dL (ref 2.5–4.6)

## 2020-01-01 LAB — CBG MONITORING, ED
Glucose-Capillary: 142 mg/dL — ABNORMAL HIGH (ref 70–99)
Glucose-Capillary: 162 mg/dL — ABNORMAL HIGH (ref 70–99)
Glucose-Capillary: 230 mg/dL — ABNORMAL HIGH (ref 70–99)
Glucose-Capillary: 338 mg/dL — ABNORMAL HIGH (ref 70–99)
Glucose-Capillary: 397 mg/dL — ABNORMAL HIGH (ref 70–99)
Glucose-Capillary: 463 mg/dL — ABNORMAL HIGH (ref 70–99)
Glucose-Capillary: 74 mg/dL (ref 70–99)
Glucose-Capillary: 89 mg/dL (ref 70–99)
Glucose-Capillary: 94 mg/dL (ref 70–99)

## 2020-01-01 LAB — SODIUM, URINE, RANDOM: Sodium, Ur: 23 mmol/L

## 2020-01-01 LAB — CREATININE, URINE, RANDOM: Creatinine, Urine: 49.93 mg/dL

## 2020-01-01 LAB — OSMOLALITY, URINE: Osmolality, Ur: 400 mOsm/kg (ref 300–900)

## 2020-01-01 LAB — SARS CORONAVIRUS 2 BY RT PCR (HOSPITAL ORDER, PERFORMED IN ~~LOC~~ HOSPITAL LAB): SARS Coronavirus 2: NEGATIVE

## 2020-01-01 LAB — MAGNESIUM: Magnesium: 2.4 mg/dL (ref 1.7–2.4)

## 2020-01-01 MED ORDER — AMOXICILLIN-POT CLAVULANATE 875-125 MG PO TABS
1.0000 | ORAL_TABLET | Freq: Two times a day (BID) | ORAL | 0 refills | Status: AC
Start: 2020-01-01 — End: ?

## 2020-01-01 MED ORDER — INSULIN ASPART 100 UNIT/ML ~~LOC~~ SOLN
0.0000 [IU] | Freq: Every day | SUBCUTANEOUS | Status: DC
  Filled 2020-01-01: qty 0.05

## 2020-01-01 MED ORDER — INSULIN ASPART 100 UNIT/ML ~~LOC~~ SOLN
0.0000 [IU] | Freq: Three times a day (TID) | SUBCUTANEOUS | Status: DC
  Filled 2020-01-01: qty 0.06

## 2020-01-01 MED ORDER — INSULIN GLARGINE 100 UNIT/ML ~~LOC~~ SOLN
20.0000 [IU] | SUBCUTANEOUS | Status: DC
  Administered 2020-01-01: 20 [IU] via SUBCUTANEOUS
  Filled 2020-01-01: qty 0.2

## 2020-01-01 MED ORDER — INSULIN GLARGINE 100 UNIT/ML ~~LOC~~ SOLN
5.0000 [IU] | SUBCUTANEOUS | Status: DC
  Filled 2020-01-01: qty 0.05

## 2020-01-01 NOTE — Progress Notes (Signed)
PT Cancellation Note  Patient Details Name: Chalese Peach MRN: [id number] DOB: 1927/05/01   Cancelled Treatment:    Reason Eval/Treat Not Completed: PT screened, no needs identified, will sign off (Patient dressed at bedside with her daughter. She does not feel a need for Acute PT services and is ready to go home. Patient and daughter happy to have some education on options for HHPT or OPPT and educated to ask for referral from PCP.) Will sign off at this time as pt is discharging home today. Please re-consult if status changes.    Verner Mould, DPT Acute Rehabilitation Services  Office [phone] Pager [phone]  01/01/2020 11:46 AM

## 2020-01-01 NOTE — Progress Notes (Signed)
Pt on insulin drip and NS, no order for D5.  Three readings below 180, last 2 were 89 and 74.  RN in ER stopped drip and called for orders.  Started sliding scale but may need custom scale based on family's report of recent hypoglycemic episodes into the 50s.

## 2020-01-01 NOTE — Discharge Summary (Signed)
Physician Discharge Summary  Purity Irmen BZJ:[id number] DOB: 1926/08/15 DOA: 12/31/2019  PCP: Reynold Bowen, MD  Admit date: 12/31/2019 Discharge date: 01/01/2020  Admitted From: Home Disposition: Home  Recommendations for Outpatient Follow-up:  1. Follow up with PCP in 1-2 weeks 2. Continue Augmentin for UTI for 4 additional days  Home Health: None Equipment/Devices: None  Discharge Condition: Stable CODE STATUS: Full code Diet recommendation: Carb modified  HPI: Per admitting MD, Judy Kent is a 86 y.o. female with medical history significant of Dm2 on insuline, diastolic CHF, HTN, A. Fib on coumadin , Hypothyrodism Presented with have been having elevated BG for the past few days BG has been the highest today it was up to 600.  Of note per family 2 weeks prior to that his blood sugar has been staying in the 40s and 50s occasionally she had to skip her insulin. She has Libre sensor at it started to say high today, it does that when it goes up above 500 Family thinks she has been eating all the right things  mild confusion this AM  Daughter noted this AM resolved by the time patient presented to emergency department Family is concerned as her blood sugar has been elevated in the past when she has urinary tract infection and that usually is similar presentation  Hospital Course / Discharge diagnoses: Principal problem Hyperglycemia, underlying DM2, insulin-dependent -patient was admitted to the hospital with refractory hyper glycemia at home as well as mild encephalopathy.  She was placed on insulin infusion, her CBGs have normalized.  There was no evidence of DKA.  Her mental status has returned to normal.  In discussion with her daughter, she may have had some UTI type symptoms, and usually when she gets a UTI she gets significantly elevated CBGs.  She was able to tolerate a diet in the morning, asking to go home, will be discharged in stable condition with outpatient  follow-up.  Patient's daughter seems to be doing an excellent job in terms of her insulin regimen as well as monitoring her CBGs.  Active problems: Atrial fibrillation, chronic, continue Eliquis and rest of home medications Essential hypertension-resume home medications Chronic diastolic CHF-appears euvolemic Acute lower UTI-received a dose of ceftriaxone, will be discharged on Augmentin to finish a 5-day course Acute kidney injury on chronic kidney disease stage IIIa-Baseline creatinine 0.9-1.1, back in 2019, it was 1.5 on admission and improved with fluids at 1.25 which is probably close to her current baseline.  Discharge Instructions   Allergies as of 01/01/2020      Reactions   Cantaloupe (diagnostic) Swelling   Sulfa Antibiotics Shortness Of Breath      Medication List    STOP taking these medications   insulin degludec 100 UNIT/ML FlexTouch Pen Commonly known as: Tyler Aas FlexTouch   Lutein 20 MG Caps   mupirocin ointment 2 % Commonly known as: BACTROBAN     TAKE these medications   amLODipine 10 MG tablet Commonly known as: NORVASC TAKE 1 TABLET BY MOUTH EVERY DAY   amoxicillin-clavulanate 875-125 MG tablet Commonly known as: Augmentin Take 1 tablet by mouth 2 (two) times daily.   b complex vitamins tablet Take 1 tablet by mouth every evening.   Cranberry 1000 MG Caps Take 1 capsule by mouth daily.   Eliquis 2.5 MG Tabs tablet Generic drug: apixaban TAKE 1 TABLET TWICE A DAY   Ferrex 28 Misc Take 28 mg by mouth every other day.   HumaLOG 100 UNIT/ML cartridge Generic drug: insulin  lispro Inject 0-4 Units into the skin 3 (three) times daily with meals. 1 unit per 12 carbs per sliding scale,adding  1 unit if BS is 150-200.   levothyroxine 50 MCG tablet Commonly known as: SYNTHROID Take 50 mcg by mouth daily before breakfast.   lisinopril 10 MG tablet Commonly known as: ZESTRIL Take 10 mg by mouth at bedtime.   multivitamin-lutein Caps capsule Take  1 capsule by mouth daily.   OMEGA 3-6-9 COMPLEX PO Take 1 capsule by mouth 2 (two) times daily.   PROBIOTIC PO Take 2 capsules by mouth daily.   torsemide 20 MG tablet Commonly known as: DEMADEX TAKE 1 TABLET BY MOUTH 2 TIMES DAILY. ONE IN THE MORNING AND ONE IN THE AFTERNOON What changed: See the new instructions.   Toujeo Max SoloStar 300 UNIT/ML Solostar Pen Generic drug: insulin glargine (2 Unit Dial) Inject 24-30 Units into the skin daily.   Vitamin D3 125 MCG (5000 UT) Caps Take 5,000 Units by mouth every evening.       Follow-up Information    Reynold Bowen, MD. Schedule an appointment as soon as possible for a visit in 1 week(s).   Specialty: Endocrinology Contact information: [place] [address] [phone]               Consultations:  None   Procedures/Studies:  DG Chest Portable 1 View  Result Date: 12/31/2019 CLINICAL DATA:  history of afib and CHF; hyperglycemic EXAM: PORTABLE CHEST 1 VIEW COMPARISON:  Radiograph 07/16/2017 FINDINGS: Chronic cardiomegaly, unchanged. Unchanged mediastinal contours with aortic atherosclerosis. There are trace bilateral pleural effusions. No pulmonary edema. No focal airspace disease. No pneumothorax. Remote left rib fracture. IMPRESSION: Chronic cardiomegaly. Trace bilateral pleural effusions. Aortic Atherosclerosis (ICD10-I70.0). Electronically Signed   By: Keith Rake M.D.   On: 12/31/2019 20:45      Subjective: - no chest pain, shortness of breath, no abdominal pain, nausea or vomiting.   Discharge Exam: BP (!) 153/72   Pulse 73   Temp 97.7 F (36.5 C) (Oral)   Resp 20   Ht 5' (1.524 m)   Wt 50.8 kg   SpO2 98%   BMI 21.87 kg/m   General: Pt is alert, awake, not in acute distress Cardiovascular: RRR, S1/S2 +, no rubs, no gallops Respiratory: CTA bilaterally, no wheezing, no rhonchi Abdominal: Soft, NT, ND, bowel sounds + Extremities: no edema, no cyanosis    The results  of significant diagnostics from this hospitalization (including imaging, microbiology, ancillary and laboratory) are listed below for reference.     Microbiology: Recent Results (from the past 240 hour(s))  SARS Coronavirus 2 by RT PCR (hospital order, performed in [facility] hospital lab) Nasopharyngeal Nasopharyngeal Swab     Status: None   Collection Time: 01/01/20  1:32 AM   Specimen: Nasopharyngeal Swab  Result Value Ref Range Status   SARS Coronavirus 2 NEGATIVE NEGATIVE Final    Comment: (NOTE) SARS-CoV-2 target nucleic acids are NOT DETECTED.  The SARS-CoV-2 RNA is generally detectable in upper and lower respiratory specimens during the acute phase of infection. The lowest concentration of SARS-CoV-2 viral copies this assay can detect is 250 copies / mL. A negative result does not preclude SARS-CoV-2 infection and should not be used as the sole basis for treatment or other patient management decisions.  A negative result may occur with improper specimen collection / handling, submission of specimen other than nasopharyngeal swab, presence of viral mutation(s) within the areas targeted by this assay,  and inadequate number of viral copies (<250 copies / mL). A negative result must be combined with clinical observations, patient history, and epidemiological information.  Fact Sheet for Patients:   StrictlyIdeas.no  Fact Sheet for Healthcare Providers: BankingDealers.co.za  This test is not yet approved or  cleared by the [place] FDA and has been authorized for detection and/or diagnosis of SARS-CoV-2 by FDA under an Emergency Use Authorization (EUA).  This EUA will remain in effect (meaning this test can be used) for the duration of the COVID-19 declaration under Section 564(b)(1) of the Act, 21 U.S.C. section 360bbb-3(b)(1), unless the authorization is terminated or revoked sooner.  Performed at [facility], [place] [address] [place], [place] [address]      Labs: Basic Metabolic Panel: Recent Labs  Lab 12/31/19 1836 01/01/20 0539  NA 126* 134*  K 5.5* 3.8  CL 86* 99  CO2 24 25  GLUCOSE 569* 96  BUN 65* 58*  CREATININE 1.50* 1.25*  CALCIUM 9.5 9.0  MG  --  2.4  PHOS  --  2.7   Liver Function Tests: Recent Labs  Lab 01/01/20 0539  AST 37  ALT 34  ALKPHOS 63  BILITOT 1.0  PROT 5.8*  ALBUMIN 3.6   CBC: Recent Labs  Lab 12/31/19 1836 01/01/20 0539  WBC 8.1 8.0  NEUTROABS  --  4.8  HGB 13.5 11.8*  HCT 38.4 33.7*  MCV 93.4 91.6  PLT 184 167   CBG: Recent Labs  Lab 01/01/20 0433 01/01/20 0538 01/01/20 0618 01/01/20 0730 01/01/20 1056  GLUCAP 142* 89 74 94 162*   Hgb A1c No results for input(s): HGBA1C in the last 72 hours. Lipid Profile No results for input(s): CHOL, HDL, LDLCALC, TRIG, CHOLHDL, LDLDIRECT in the last 72 hours. Thyroid function studies Recent Labs    01/01/20 0539  TSH 1.486   Urinalysis    Component Value Date/Time   COLORURINE YELLOW 12/31/2019 1848   APPEARANCEUR HAZY (A) 12/31/2019 1848   LABSPEC 1.011 12/31/2019 1848   PHURINE 5.0 12/31/2019 1848   GLUCOSEU >=500 (A) 12/31/2019 1848   HGBUR NEGATIVE 12/31/2019 1848   BILIRUBINUR NEGATIVE 12/31/2019 1848   KETONESUR 5 (A) 12/31/2019 1848   PROTEINUR NEGATIVE 12/31/2019 1848   NITRITE NEGATIVE 12/31/2019 1848   LEUKOCYTESUR MODERATE (A) 12/31/2019 1848    FURTHER DISCHARGE INSTRUCTIONS:   Get Medicines reviewed and adjusted: Please take all your medications with you for your next visit with your Primary MD   Laboratory/radiological data: Please request your Primary MD to go over all hospital tests and procedure/radiological results at the follow up, please ask your Primary MD to get all Hospital records sent to his/her office.   In some cases, they will be blood work, cultures and biopsy results pending at the time of your discharge. Please request that your  primary care M.D. goes through all the records of your hospital data and follows up on these results.   Also Note the following: If you experience worsening of your admission symptoms, develop shortness of breath, life threatening emergency, suicidal or homicidal thoughts you must seek medical attention immediately by calling 911 or calling your MD immediately  if symptoms less severe.   You must read complete instructions/literature along with all the possible adverse reactions/side effects for all the Medicines you take and that have been prescribed to you. Take any new Medicines after you have completely understood and accpet all the possible adverse reactions/side effects.    Do  not drive when taking Pain medications or sleeping medications (Benzodaizepines)   Do not take more than prescribed Pain, Sleep and Anxiety Medications. It is not advisable to combine anxiety,sleep and pain medications without talking with your primary care practitioner   Special Instructions: If you have smoked or chewed Tobacco  in the last 2 yrs please stop smoking, stop any regular Alcohol  and or any Recreational drug use.   Wear Seat belts while driving.   Please note: You were cared for by a hospitalist during your hospital stay. Once you are discharged, your primary care physician will handle any further medical issues. Please note that NO REFILLS for any discharge medications will be authorized once you are discharged, as it is imperative that you return to your primary care physician (or establish a relationship with a primary care physician if you do not have one) for your post hospital discharge needs so that they can reassess your need for medications and monitor your lab values.  Time coordinating discharge: 40 minutes  SIGNED:  Marzetta Board, MD, PhD 01/01/2020, 11:02 AM

## 2020-01-01 NOTE — Discharge Instructions (Signed)
Follow with Reynold Bowen, MD in 5-7 days  Please get a complete blood count and chemistry panel checked by your Primary MD at your next visit, and again as instructed by your Primary MD. Please get your medications reviewed and adjusted by your Primary MD.  Please request your Primary MD to go over all Hospital Tests and Procedure/Radiological results at the follow up, please get all Hospital records sent to your Prim MD by signing hospital release before you go home.  In some cases, there will be blood work, cultures and biopsy results pending at the time of your discharge. Please request that your primary care M.D. goes through all the records of your hospital data and follows up on these results.  If you had Pneumonia of Lung problems at the Hospital: Please get a 2 view Chest X ray done in 6-8 weeks after hospital discharge or sooner if instructed by your Primary MD.  If you have Congestive Heart Failure: Please call your Cardiologist or Primary MD anytime you have any of the following symptoms:  1) 3 pound weight gain in 24 hours or 5 pounds in 1 week  2) shortness of breath, with or without a dry hacking cough  3) swelling in the hands, feet or stomach  4) if you have to sleep on extra pillows at night in order to breathe  Follow cardiac low salt diet and 1.5 lit/day fluid restriction.  If you have diabetes Accuchecks 4 times/day, Once in AM empty stomach and then before each meal. Log in all results and show them to your primary doctor at your next visit. If any glucose reading is under 80 or above 300 call your primary MD immediately.  If you have Seizure/Convulsions/Epilepsy: Please do not drive, operate heavy machinery, participate in activities at heights or participate in high speed sports until you have seen by Primary MD or a Neurologist and advised to do so again. Per [facility] statutes, patients with seizures are not allowed to drive until they have been  seizure-free for six months.  Use caution when using heavy equipment or power tools. Avoid working on ladders or at heights. Take showers instead of baths. Ensure the water temperature is not too high on the home water heater. Do not go swimming alone. Do not lock yourself in a room alone (i.e. bathroom). When caring for infants or small children, sit down when holding, feeding, or changing them to minimize risk of injury to the child in the event you have a seizure. Maintain good sleep hygiene. Avoid alcohol.   If you had Gastrointestinal Bleeding: Please ask your Primary MD to check a complete blood count within one week of discharge or at your next visit. Your endoscopic/colonoscopic biopsies that are pending at the time of discharge, will also need to followed by your Primary MD.  Get Medicines reviewed and adjusted. Please take all your medications with you for your next visit with your Primary MD  Please request your Primary MD to go over all hospital tests and procedure/radiological results at the follow up, please ask your Primary MD to get all Hospital records sent to his/her office.  If you experience worsening of your admission symptoms, develop shortness of breath, life threatening emergency, suicidal or homicidal thoughts you must seek medical attention immediately by calling 911 or calling your MD immediately  if symptoms less severe.  You must read complete instructions/literature along with all the possible adverse reactions/side effects for all the Medicines you take  and that have been prescribed to you. Take any new Medicines after you have completely understood and accpet all the possible adverse reactions/side effects.   Do not drive or operate heavy machinery when taking Pain medications.   Do not take more than prescribed Pain, Sleep and Anxiety Medications  Special Instructions: If you have smoked or chewed Tobacco  in the last 2 yrs please stop smoking, stop any regular  Alcohol  and or any Recreational drug use.  Wear Seat belts while driving.  Please note You were cared for by a hospitalist during your hospital stay. If you have any questions about your discharge medications or the care you received while you were in the hospital after you are discharged, you can call the unit and asked to speak with the hospitalist on call if the hospitalist that took care of you is not available. Once you are discharged, your primary care physician will handle any further medical issues. Please note that NO REFILLS for any discharge medications will be authorized once you are discharged, as it is imperative that you return to your primary care physician (or establish a relationship with a primary care physician if you do not have one) for your aftercare needs so that they can reassess your need for medications and monitor your lab values.  You can reach the hospitalist office at phone [phone] or fax [phone]   If you do not have a primary care physician, you can call [phone] for a physician referral.  Activity: As tolerated with Full fall precautions use walker/cane & assistance as needed    Diet: carb modified  Disposition Home

## 2020-01-01 NOTE — ED Notes (Signed)
Pt is unable to provide urine sample at this time. 

## 2020-01-01 NOTE — Progress Notes (Signed)
Inpatient Diabetes Program Recommendations  AACE/ADA: New Consensus Statement on Inpatient Glycemic Control (2015)  Target Ranges:  Prepandial:   less than 140 mg/dL      Peak postprandial:   less than 180 mg/dL (1-2 hours)      Critically ill patients:  140 - 180 mg/dL   Lab Results  Component Value Date   GLUCAP 162 (H) 01/01/2020   HGBA1C 7.9 (H) 06/08/2017    Review of Glycemic Control  Diabetes history: Dm type 1, Sees Dr. Forde Dandy, Endocrinologist at [facility] Diabetes medications: Toujeo 24-30 units Daily, humalog 0-4 units ti SSI + 1:12 CHO, 1 units for bs 150-200   Daughter at bedside reports lower glucose trends at home around the 70's sometimes a couple of times a day without Humalog being given. Until the last day then glucose trends increased very high with max dose of toujeo given. Pt saw Juliann Pulse, CDE 3 weeks ago and reviewed her CGM freestyle libre device. Pt has libre present on left arm.  Suggested pt to follow up with Dr. Forde Dandy and report glucose trends to him and/or Juliann Pulse CDE, at the office. Sounds like titration is needed with basal insulin.  Thanks,  Tama Headings RN, MSN, BC-ADM Inpatient Diabetes Coordinator Team Pager [phone] (8a-5p)

## 2020-01-03 LAB — URINE CULTURE: Culture: >=100000 — AB

## 2020-01-04 ENCOUNTER — Telehealth: Payer: Self-pay | Admitting: *Deleted

## 2020-01-04 NOTE — Progress Notes (Signed)
ED Antimicrobial Stewardship Positive Culture Follow Up   Judy Kent is an 86 y.o. female who presented to [facility] on 12/31/2019 with a chief complaint of  Chief Complaint  Patient presents with  . Hyperglycemia    Recent Results (from the past 720 hour(s))  Urine culture     Status: Abnormal   Collection Time: 12/31/19  6:48 PM   Specimen: Urine, Clean Catch  Result Value Ref Range Status   Specimen Description   Final    URINE, CLEAN CATCH Performed at [facility], [place] [address], [place], [place] [address]    Special Requests   Final    NONE Performed at [facility], [place] [address] [place], [place] [address]    Culture >=100,000 COLONIES/mL CITROBACTER KOSERI (A)  Final   Report Status 01/03/2020 FINAL  Final   Organism ID, Bacteria CITROBACTER KOSERI (A)  Final      Susceptibility   Citrobacter koseri - MIC*    CEFAZOLIN <=4 SENSITIVE Sensitive     CEFTRIAXONE <=0.25 SENSITIVE Sensitive     CIPROFLOXACIN <=0.25 SENSITIVE Sensitive     GENTAMICIN <=1 SENSITIVE Sensitive     IMIPENEM <=0.25 SENSITIVE Sensitive     NITROFURANTOIN 64 INTERMEDIATE Intermediate     TRIMETH/SULFA <=20 SENSITIVE Sensitive     PIP/TAZO <=4 SENSITIVE Sensitive     * >=100,000 COLONIES/mL CITROBACTER KOSERI  SARS Coronavirus 2 by RT PCR (hospital order, performed in [place] hospital lab) Nasopharyngeal Nasopharyngeal Swab     Status: None   Collection Time: 01/01/20  1:32 AM   Specimen: Nasopharyngeal Swab  Result Value Ref Range Status   SARS Coronavirus 2 NEGATIVE NEGATIVE Final    Comment: (NOTE) SARS-CoV-2 target nucleic acids are NOT DETECTED.  The SARS-CoV-2 RNA is generally detectable in upper and lower respiratory specimens during the acute phase of infection. The lowest concentration of SARS-CoV-2 viral copies this assay can detect is 250 copies / mL. A negative result does not preclude SARS-CoV-2 infection and should not be used  as the sole basis for treatment or other patient management decisions.  A negative result may occur with improper specimen collection / handling, submission of specimen other than nasopharyngeal swab, presence of viral mutation(s) within the areas targeted by this assay, and inadequate number of viral copies (<250 copies / mL). A negative result must be combined with clinical observations, patient history, and epidemiological information.  Fact Sheet for Patients:   StrictlyIdeas.no  Fact Sheet for Healthcare Providers: BankingDealers.co.za  This test is not yet approved or  cleared by the [place] FDA and has been authorized for detection and/or diagnosis of SARS-CoV-2 by FDA under an Emergency Use Authorization (EUA).  This EUA will remain in effect (meaning this test can be used) for the duration of the COVID-19 declaration under Section 564(b)(1) of the Act, 21 U.S.C. section 360bbb-3(b)(1), unless the authorization is terminated or revoked sooner.  Performed at [facility], [place] [address] [place], [place] [address]     [x]  Treated with Augmentin, organism resistant to prescribed antimicrobial  New antibiotic prescription: Cephalexin 250 mg PO BID x5 days. No refills.   Instruct patient to stop taking Augmentin, start course of cephalexin.  ED Provider: Madilyn Hook, PA-C  Lenis Noon, PharmD 01/04/2020, 10:37 AM Clinical Pharmacist [phone]

## 2020-01-04 NOTE — Telephone Encounter (Signed)
Post ED Visit - Positive Culture Follow-up: Unsuccessful Patient Follow-up  Culture assessed and recommendations reviewed by:  []  Elenor Quinones, Pharm.D. []  Heide Guile, Pharm.D., BCPS AQ-ID []  Parks Neptune, Pharm.D., BCPS []  Alycia Rossetti, Pharm.D., BCPS []  [place], [place].D., BCPS, AAHIVP []  Legrand Como, Pharm.D., BCPS, AAHIVP []  Wynell Balloon, PharmD []  Vincenza Hews, PharmD, BCPS  Positive urine culture  []  Patient discharged without antimicrobial prescription and treatment is now indicated []  Organism is resistant to prescribed ED discharge antimicrobial []  Patient with positive blood cultures  Plan:  Stop Augmentin, Start Cephalexin 250mg  PO BID x 5 days, Carmelia Bake, PA-C  Unable to contact patient after 3 attempts, letter will be sent to address on file  Ardeen Fillers 01/04/2020, 11:15 AM

## 2020-02-13 ENCOUNTER — Encounter: Payer: Self-pay | Admitting: Cardiology

## 2020-06-26 ENCOUNTER — Ambulatory Visit: Payer: Medicare HMO | Admitting: Cardiology

## 2020-07-14 ENCOUNTER — Other Ambulatory Visit: Payer: Self-pay | Admitting: Cardiology

## 2020-07-18 ENCOUNTER — Other Ambulatory Visit: Payer: Self-pay | Admitting: Cardiology

## 2020-07-31 ENCOUNTER — Other Ambulatory Visit: Payer: Self-pay | Admitting: Cardiology

## 2020-08-18 ENCOUNTER — Ambulatory Visit: Payer: Medicare HMO | Admitting: Cardiology
